# Patient Record
Sex: Female | Born: 1990 | Race: Black or African American | Hispanic: No | Marital: Single | Smoking: Current every day smoker
Health system: Southern US, Community
[De-identification: ages and names within clinical notes are randomized; demographics above are authoritative.]

## PROBLEM LIST (undated history)

## (undated) DIAGNOSIS — R519 Headache, unspecified: Secondary | ICD-10-CM

## (undated) DIAGNOSIS — H4710 Unspecified papilledema: Secondary | ICD-10-CM

## (undated) DIAGNOSIS — F329 Major depressive disorder, single episode, unspecified: Secondary | ICD-10-CM

## (undated) HISTORY — PX: WISDOM TOOTH EXTRACTION: SHX21

## (undated) HISTORY — DX: Unspecified papilledema: H47.10

## (undated) HISTORY — PX: OTHER SURGICAL HISTORY: SHX169

## (undated) HISTORY — DX: Morbid (severe) obesity due to excess calories: E66.01

## (undated) HISTORY — DX: Major depressive disorder, single episode, unspecified: F32.9

## (undated) HISTORY — DX: Headache, unspecified: R51.9

---
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
## 2009-01-30 ENCOUNTER — Emergency Department: Payer: Self-pay | Admitting: Unknown Physician Specialty

## 2009-08-15 ENCOUNTER — Observation Stay: Payer: Self-pay

## 2009-11-02 ENCOUNTER — Inpatient Hospital Stay: Payer: Self-pay | Admitting: Internal Medicine

## 2009-12-21 ENCOUNTER — Emergency Department: Payer: Self-pay | Admitting: Internal Medicine

## 2010-06-11 ENCOUNTER — Emergency Department: Payer: Self-pay | Admitting: Emergency Medicine

## 2011-03-27 ENCOUNTER — Emergency Department: Payer: Self-pay | Admitting: Emergency Medicine

## 2012-08-05 ENCOUNTER — Emergency Department: Payer: Self-pay | Admitting: Emergency Medicine

## 2012-08-05 LAB — URINALYSIS, COMPLETE
Blood: NEGATIVE
Glucose,UR: NEGATIVE mg/dL (ref 0–75)
Leukocyte Esterase: NEGATIVE
Nitrite: NEGATIVE
Protein: 100
RBC,UR: 2 /HPF (ref 0–5)
Specific Gravity: 1.019 (ref 1.003–1.030)
Squamous Epithelial: 3
WBC UR: 3 /HPF (ref 0–5)

## 2012-08-05 LAB — COMPREHENSIVE METABOLIC PANEL
Albumin: 2.6 g/dL — ABNORMAL LOW (ref 3.4–5.0)
Alkaline Phosphatase: 115 U/L (ref 50–136)
BUN: 7 mg/dL (ref 7–18)
Bilirubin,Total: 0.4 mg/dL (ref 0.2–1.0)
Glucose: 85 mg/dL (ref 65–99)
Osmolality: 273 (ref 275–301)
Potassium: 4.1 mmol/L (ref 3.5–5.1)
SGOT(AST): 46 U/L — ABNORMAL HIGH (ref 15–37)

## 2012-08-05 LAB — CBC
HGB: 12.4 g/dL (ref 12.0–16.0)
MCH: 30.1 pg (ref 26.0–34.0)
MCV: 89 fL (ref 80–100)
Platelet: 307 10*3/uL (ref 150–440)
RDW: 13.8 % (ref 11.5–14.5)

## 2012-08-05 LAB — GC/CHLAMYDIA PROBE AMP

## 2012-10-15 ENCOUNTER — Observation Stay: Payer: Self-pay | Admitting: Obstetrics and Gynecology

## 2012-10-15 LAB — URINALYSIS, COMPLETE
Blood: NEGATIVE
Glucose,UR: NEGATIVE mg/dL (ref 0–75)
Ketone: NEGATIVE
Nitrite: NEGATIVE
Ph: 8 (ref 4.5–8.0)
RBC,UR: 1 /HPF (ref 0–5)
Specific Gravity: 1.016 (ref 1.003–1.030)
Squamous Epithelial: NONE SEEN
WBC UR: 2 /HPF (ref 0–5)

## 2012-11-05 ENCOUNTER — Ambulatory Visit: Payer: Self-pay | Admitting: Obstetrics and Gynecology

## 2012-11-18 ENCOUNTER — Ambulatory Visit: Payer: Self-pay | Admitting: Obstetrics and Gynecology

## 2012-11-22 ENCOUNTER — Observation Stay: Payer: Self-pay | Admitting: Obstetrics and Gynecology

## 2012-11-22 LAB — COMPREHENSIVE METABOLIC PANEL
Albumin: 2.4 g/dL — ABNORMAL LOW (ref 3.4–5.0)
Alkaline Phosphatase: 140 U/L — ABNORMAL HIGH (ref 50–136)
Anion Gap: 6 — ABNORMAL LOW (ref 7–16)
Bilirubin,Total: 0.4 mg/dL (ref 0.2–1.0)
Chloride: 106 mmol/L (ref 98–107)
Co2: 24 mmol/L (ref 21–32)
EGFR (Non-African Amer.): >60
Osmolality: 268 (ref 275–301)
Potassium: 3.7 mmol/L (ref 3.5–5.1)
SGPT (ALT): 33 U/L (ref 12–78)
Total Protein: 6 g/dL — ABNORMAL LOW (ref 6.4–8.2)

## 2012-11-22 LAB — URINALYSIS, COMPLETE
Bilirubin,UR: NEGATIVE
Glucose,UR: NEGATIVE mg/dL (ref 0–75)
Ketone: NEGATIVE
Ph: 7 (ref 4.5–8.0)
RBC,UR: 1 /HPF (ref 0–5)
Specific Gravity: 1.014 (ref 1.003–1.030)
WBC UR: 8 /HPF (ref 0–5)

## 2012-11-22 LAB — CBC WITH DIFFERENTIAL/PLATELET
Basophil %: 0.1 %
Eosinophil #: 0 10*3/uL (ref 0.0–0.7)
HCT: 34.6 % — ABNORMAL LOW (ref 35.0–47.0)
HGB: 12.3 g/dL (ref 12.0–16.0)
Lymphocyte #: 1.3 10*3/uL (ref 1.0–3.6)
Lymphocyte %: 10.8 %
MCH: 31.8 pg (ref 26.0–34.0)
Neutrophil #: 10 10*3/uL — ABNORMAL HIGH (ref 1.4–6.5)
Platelet: 213 10*3/uL (ref 150–440)
RBC: 3.86 10*6/uL (ref 3.80–5.20)
RDW: 13.8 % (ref 11.5–14.5)
WBC: 12.1 10*3/uL — ABNORMAL HIGH (ref 3.6–11.0)

## 2012-11-22 LAB — LIPASE, BLOOD: Lipase: 103 U/L (ref 73–393)

## 2012-12-18 ENCOUNTER — Ambulatory Visit: Payer: Self-pay | Admitting: Obstetrics and Gynecology

## 2012-12-30 ENCOUNTER — Ambulatory Visit: Payer: Self-pay | Admitting: Obstetrics and Gynecology

## 2012-12-30 LAB — CBC WITH DIFFERENTIAL/PLATELET
Basophil #: 0 10*3/uL (ref 0.0–0.1)
Basophil %: 0.2 %
Eosinophil #: 0 10*3/uL (ref 0.0–0.7)
HCT: 37.1 % (ref 35.0–47.0)
HGB: 13 g/dL (ref 12.0–16.0)
Lymphocyte #: 2.3 10*3/uL (ref 1.0–3.6)
MCH: 31.4 pg (ref 26.0–34.0)
MCHC: 35 g/dL (ref 32.0–36.0)
Monocyte #: 0.8 x10 3/mm (ref 0.2–0.9)
Neutrophil #: 7.8 10*3/uL — ABNORMAL HIGH (ref 1.4–6.5)
Platelet: 224 10*3/uL (ref 150–440)
RBC: 4.13 10*6/uL (ref 3.80–5.20)
RDW: 14 % (ref 11.5–14.5)
WBC: 11 10*3/uL (ref 3.6–11.0)

## 2012-12-31 ENCOUNTER — Inpatient Hospital Stay: Payer: Self-pay | Admitting: Obstetrics and Gynecology

## 2012-12-31 LAB — GC/CHLAMYDIA PROBE AMP

## 2013-01-01 LAB — HEMATOCRIT: HCT: 33 % — ABNORMAL LOW (ref 35.0–47.0)

## 2013-01-18 ENCOUNTER — Emergency Department: Payer: Self-pay | Admitting: Emergency Medicine

## 2013-02-11 ENCOUNTER — Encounter: Payer: Self-pay | Admitting: *Deleted

## 2013-02-20 ENCOUNTER — Ambulatory Visit: Payer: Self-pay | Admitting: Obstetrics and Gynecology

## 2013-02-26 ENCOUNTER — Encounter: Payer: Self-pay | Admitting: General Surgery

## 2013-02-26 ENCOUNTER — Ambulatory Visit (INDEPENDENT_AMBULATORY_CARE_PROVIDER_SITE_OTHER): Payer: Medicaid Other | Admitting: General Surgery

## 2013-02-26 VITALS — BP 136/78 | HR 74 | Resp 16 | Ht 64.0 in | Wt 256.0 lb

## 2013-02-26 DIAGNOSIS — K8010 Calculus of gallbladder with chronic cholecystitis without obstruction: Secondary | ICD-10-CM

## 2013-02-26 NOTE — Patient Instructions (Addendum)
Laparoscopic Cholecystectomy Laparoscopic cholecystectomy is surgery to remove the gallbladder. The gallbladder is located slightly to the right of center in the abdomen, behind the liver. It is a concentrating and storage sac for the bile produced in the liver. Bile aids in the digestion and absorption of fats. Gallbladder disease (cholecystitis) is an inflammation of your gallbladder. This condition is usually caused by a buildup of gallstones (cholelithiasis) in your gallbladder. Gallstones can block the flow of bile, resulting in inflammation and pain. In severe cases, emergency surgery may be required. When emergency surgery is not required, you will have time to prepare for the procedure. Laparoscopic surgery is an alternative to open surgery. Laparoscopic surgery usually has a shorter recovery time. Your common bile duct may also need to be examined and explored. Your caregiver will discuss this with you if he or she feels this should be done. If stones are found in the common bile duct, they may be removed. LET YOUR CAREGIVER KNOW ABOUT:  Allergies to food or medicine.  Medicines taken, including vitamins, herbs, eyedrops, over-the-counter medicines, and creams.  Use of steroids (by mouth or creams).  Previous problems with anesthetics or numbing medicines.  History of bleeding problems or blood clots.  Previous surgery.  Other health problems, including diabetes and kidney problems.  Possibility of pregnancy, if this applies. RISKS AND COMPLICATIONS All surgery is associated with risks. Some problems that may occur following this procedure include:  Infection.  Damage to the common bile duct, nerves, arteries, veins, or other internal organs such as the stomach or intestines.  Bleeding.  A stone may remain in the common bile duct. BEFORE THE PROCEDURE  Do not take aspirin for 3 days prior to surgery or blood thinners for 1 week prior to surgery.  Do not eat or drink  anything after midnight the night before surgery.  Let your caregiver know if you develop a cold or other infectious problem prior to surgery.  You should be present 60 minutes before the procedure or as directed. PROCEDURE  You will be given medicine that makes you sleep (general anesthetic). When you are asleep, your surgeon will make several small cuts (incisions) in your abdomen. One of these incisions is used to insert a small, lighted scope (laparoscope) into the abdomen. The laparoscope helps the surgeon see into your abdomen. Carbon dioxide gas will be pumped into your abdomen. The gas allows more room for the surgeon to perform your surgery. Other operating instruments are inserted through the other incisions. Laparoscopic procedures may not be appropriate when:  There is major scarring from previous surgery.  The gallbladder is extremely inflamed.  There are bleeding disorders or unexpected cirrhosis of the liver.  A pregnancy is near term.  Other conditions make the laparoscopic procedure impossible. If your surgeon feels it is not safe to continue with a laparoscopic procedure, he or she will perform an open abdominal procedure. In this case, the surgeon will make an incision to open the abdomen. This gives the surgeon a larger view and field to work within. This may allow the surgeon to perform procedures that sometimes cannot be performed with a laparoscope alone. Open surgery has a longer recovery time. AFTER THE PROCEDURE  You will be taken to the recovery area where a nurse will watch and check your progress.  You may be allowed to go home the same day.  Do not resume physical activities until directed by your caregiver.  You may resume a normal diet and   activities as directed. Document Released: 03/06/2005 Document Revised: 05/29/2011 Document Reviewed: 10/16/2012 [facility] Patient Information 2014 [place], [place].    Patient's surgery has been scheduled for 03/17/13  at [facility]. Phone interview is scheduled for 03/10/13 between 9am and 1 pm. Patient aware of date and time and instructions. She will call us with any medication or health status changes.

## 2013-02-26 NOTE — Progress Notes (Signed)
[name], female   DOB: [birth year], 22 y.o.   MRN: [id number]  Chief Complaint  Patient presents with  . Other    HPI Rebecca Li is a 22 y.o. female here today for a evaluation of gallstone. Patient states she went to the ER in August 2014 for this problem, they did a ultrasound . She had a son 1 months ago and every since she gave birth she thinks the pain is worst.She states when she eats pancakes she has an attack and the pain last for about 10 minutes. Pain is located in the right back and the right upper quadrant area.No vomiting but nausea.   HPI  History reviewed. No pertinent past medical history.  Past Surgical History  Procedure Laterality Date  . Cesarean section  2006,2011,2014    History reviewed. No pertinent family history.  Social History History  Substance Use Topics  . Smoking status: Current Every Day Smoker -- 1.00 packs/day for 3 years  . Smokeless tobacco: Never Used  . Alcohol Use: No    Allergies  Allergen Reactions  . Sulfamethoxazole-Trimethoprim     REACTION: swelling and hives    No current outpatient prescriptions on file.   No current facility-administered medications for this visit.    Review of Systems Review of Systems  Constitutional: Negative.   Respiratory: Negative.   Cardiovascular: Negative.   Gastrointestinal: Positive for nausea, abdominal pain, blood in stool and anal bleeding. Negative for vomiting, diarrhea, constipation, abdominal distention and rectal pain.    Blood pressure 136/78, pulse 74, resp. rate 16, height 5\' 4"  (1.626 m), weight 256 lb (116.121 kg), last menstrual period 01/20/2013.  Physical Exam Physical Exam  Constitutional: She is oriented to person, place, and time. She appears well-developed and well-nourished.  Eyes: Conjunctivae are normal. No scleral icterus.  Neck: No tracheal deviation present. No thyromegaly present.  Cardiovascular: Normal rate, regular rhythm and normal heart  sounds.   Pulmonary/Chest: Effort normal and breath sounds normal.  Abdominal: Soft. Normal appearance and bowel sounds are normal. There is no hepatomegaly. There is no tenderness.  Lymphadenopathy:    She has no cervical adenopathy.  Neurological: She is alert and oriented to person, place, and time.  Skin: Skin is warm.    Data Reviewed Ultrasound reviewed-showing multiple gallstones  Assessment    Biliary colic.    Plan    Recommended Laparoscopic cholecystectomy. Reasons, risks, benefits discussed fully and pt is agreeable.     Patient's surgery has been scheduled for 03/17/13 at [facility]. Phone interview is scheduled for 03/10/13 between 9am and 1 pm. Patient aware of date and time and instructions. She will call us with any medication or health status changes.  SANKAR,SEEPLAPUTHUR G 02/26/2013, 4:36 PM

## 2013-03-04 ENCOUNTER — Other Ambulatory Visit: Payer: Self-pay | Admitting: General Surgery

## 2013-03-04 DIAGNOSIS — K8010 Calculus of gallbladder with chronic cholecystitis without obstruction: Secondary | ICD-10-CM

## 2013-03-11 ENCOUNTER — Ambulatory Visit: Payer: Self-pay | Admitting: Anesthesiology

## 2013-03-11 LAB — CBC WITH DIFFERENTIAL/PLATELET
Basophil #: 0 10*3/uL (ref 0.0–0.1)
Basophil %: 0.4 %
Eosinophil #: 0.4 10*3/uL (ref 0.0–0.7)
Eosinophil %: 3.1 %
HCT: 39.4 % (ref 35.0–47.0)
HGB: 13 g/dL (ref 12.0–16.0)
Lymphocyte #: 3.8 10*3/uL — ABNORMAL HIGH (ref 1.0–3.6)
MCHC: 33 g/dL (ref 32.0–36.0)
MCV: 89 fL (ref 80–100)
Monocyte #: 0.8 x10 3/mm (ref 0.2–0.9)
Monocyte %: 5.8 %
Neutrophil #: 8.3 10*3/uL — ABNORMAL HIGH (ref 1.4–6.5)
Neutrophil %: 62.4 %
Platelet: 302 10*3/uL (ref 150–440)
RBC: 4.42 10*6/uL (ref 3.80–5.20)
WBC: 13.3 10*3/uL — ABNORMAL HIGH (ref 3.6–11.0)

## 2013-03-11 LAB — CBC
HCT: 39 %
MCH: 29.3
MCHC: 33
MCV: 89 fL (ref 82.0–108.0)
Platelets: 302

## 2013-03-11 LAB — LAB REPORT - SCANNED
ALT: 33 U/L (ref 7–35)
Alkaline Phosphatase: 117 U/L (ref 25–125)
Bilirubin, Direct: 0.1 mg/dL (ref 0.01–0.4)
Total Bilirubin: 0.2 mg/dL (ref 0.1–1.4)
Total Protein, Serum: 6.5

## 2013-03-11 LAB — BASIC METABOLIC PANEL
BUN, Bld: 13
CO2, serum: 28
Chloride: 112 mmol/L — ABNORMAL HIGH (ref 98–107)
Creat: 0.9
Creatinine: 0.9 mg/dL (ref 0.60–1.30)
EGFR (African American): >60
EGFR (Non-African Amer.): >60
Glucose: 99
Osmolality: 281 (ref 275–301)
Potassium: 4.3 mmol/L (ref 3.5–5.1)
Sodium, serum: 141

## 2013-03-11 LAB — HEPATIC FUNCTION PANEL A (ARMC)
Albumin: 3.2 g/dL — ABNORMAL LOW (ref 3.4–5.0)
Bilirubin, Direct: <0.1 mg/dL (ref 0.00–0.20)
Bilirubin,Total: 0.2 mg/dL (ref 0.2–1.0)
SGPT (ALT): 33 U/L (ref 12–78)
Total Protein: 6.5 g/dL (ref 6.4–8.2)

## 2013-03-11 LAB — HEPATIC FUNCTION PANEL
AST: 24 U/L
Albumin, Serum(Neph): 3.2
Alkaline Phosphatase: 117 U/L
Protein: 6.5

## 2013-03-17 ENCOUNTER — Encounter: Payer: Self-pay | Admitting: General Surgery

## 2013-03-17 ENCOUNTER — Ambulatory Visit: Payer: Self-pay | Admitting: General Surgery

## 2013-03-17 DIAGNOSIS — K8010 Calculus of gallbladder with chronic cholecystitis without obstruction: Secondary | ICD-10-CM

## 2013-03-18 LAB — PATHOLOGY REPORT

## 2013-03-24 ENCOUNTER — Encounter: Payer: Self-pay | Admitting: General Surgery

## 2013-03-31 ENCOUNTER — Ambulatory Visit: Payer: Medicaid Other | Admitting: General Surgery

## 2013-04-09 ENCOUNTER — Emergency Department: Payer: Self-pay | Admitting: Emergency Medicine

## 2013-04-09 LAB — CBC WITH DIFFERENTIAL/PLATELET
BASOS PCT: 0.3 %
Basophil #: 0.1 10*3/uL (ref 0.0–0.1)
Eosinophil #: 0 10*3/uL (ref 0.0–0.7)
Eosinophil %: 0.1 %
HCT: 43.3 % (ref 35.0–47.0)
HGB: 14.5 g/dL (ref 12.0–16.0)
Lymphocyte #: 1.9 10*3/uL (ref 1.0–3.6)
Lymphocyte %: 9 %
MCH: 29.1 pg (ref 26.0–34.0)
MCHC: 33.4 g/dL (ref 32.0–36.0)
MCV: 87 fL (ref 80–100)
Monocyte #: 1 x10 3/mm — ABNORMAL HIGH (ref 0.2–0.9)
Monocyte %: 4.7 %
Neutrophil #: 17.9 10*3/uL — ABNORMAL HIGH (ref 1.4–6.5)
Neutrophil %: 85.9 %
Platelet: 261 10*3/uL (ref 150–440)
RBC: 4.96 10*6/uL (ref 3.80–5.20)
RDW: 12.9 % (ref 11.5–14.5)
WBC: 20.8 10*3/uL — AB (ref 3.6–11.0)

## 2013-04-09 LAB — URINALYSIS, COMPLETE
Bilirubin,UR: NEGATIVE
GLUCOSE, UR: NEGATIVE mg/dL (ref 0–75)
Nitrite: NEGATIVE
PH: 6 (ref 4.5–8.0)
Protein: 100
RBC,UR: 72 /HPF (ref 0–5)
Specific Gravity: 1.027 (ref 1.003–1.030)

## 2013-04-09 LAB — COMPREHENSIVE METABOLIC PANEL
Albumin: 3.3 g/dL — ABNORMAL LOW (ref 3.4–5.0)
Alkaline Phosphatase: 164 U/L — ABNORMAL HIGH
Anion Gap: 8 (ref 7–16)
BUN: 9 mg/dL (ref 7–18)
Bilirubin,Total: 1.6 mg/dL — ABNORMAL HIGH (ref 0.2–1.0)
CALCIUM: 8.9 mg/dL (ref 8.5–10.1)
Chloride: 104 mmol/L (ref 98–107)
Co2: 24 mmol/L (ref 21–32)
Creatinine: 1.16 mg/dL (ref 0.60–1.30)
EGFR (African American): >60
EGFR (Non-African Amer.): >60
Glucose: 95 mg/dL (ref 65–99)
OSMOLALITY: 270 (ref 275–301)
Potassium: 3.3 mmol/L — ABNORMAL LOW (ref 3.5–5.1)
SGOT(AST): 24 U/L (ref 15–37)
SGPT (ALT): 77 U/L (ref 12–78)
SODIUM: 136 mmol/L (ref 136–145)
TOTAL PROTEIN: 7.9 g/dL (ref 6.4–8.2)

## 2013-04-09 LAB — LIPASE, BLOOD: Lipase: 103 U/L (ref 73–393)

## 2013-04-10 LAB — RAPID INFLUENZA A&B ANTIGENS (ARMC ONLY)

## 2013-04-11 LAB — URINE CULTURE

## 2013-04-12 LAB — CULTURE, BLOOD (SINGLE)

## 2013-04-16 LAB — CULTURE, BLOOD (SINGLE)

## 2013-06-17 ENCOUNTER — Emergency Department: Payer: Self-pay | Admitting: Emergency Medicine

## 2013-06-17 LAB — CBC WITH DIFFERENTIAL/PLATELET
BASOS ABS: 0.1 10*3/uL (ref 0.0–0.1)
BASOS PCT: 0.4 %
Eosinophil #: 0.5 10*3/uL (ref 0.0–0.7)
Eosinophil %: 3 %
HCT: 42.3 % (ref 35.0–47.0)
HGB: 13.6 g/dL (ref 12.0–16.0)
LYMPHS PCT: 37.5 %
Lymphocyte #: 5.7 10*3/uL — ABNORMAL HIGH (ref 1.0–3.6)
MCH: 28.5 pg (ref 26.0–34.0)
MCHC: 32.2 g/dL (ref 32.0–36.0)
MCV: 89 fL (ref 80–100)
Monocyte #: 1 x10 3/mm — ABNORMAL HIGH (ref 0.2–0.9)
Monocyte %: 6.4 %
NEUTROS PCT: 52.7 %
Neutrophil #: 8 10*3/uL — ABNORMAL HIGH (ref 1.4–6.5)
Platelet: 299 10*3/uL (ref 150–440)
RBC: 4.78 10*6/uL (ref 3.80–5.20)
RDW: 13.2 % (ref 11.5–14.5)
WBC: 15.1 10*3/uL — ABNORMAL HIGH (ref 3.6–11.0)

## 2013-06-17 LAB — URINALYSIS, COMPLETE
Bilirubin,UR: NEGATIVE
Blood: NEGATIVE
Glucose,UR: NEGATIVE mg/dL (ref 0–75)
Ketone: NEGATIVE
NITRITE: NEGATIVE
Ph: 5 (ref 4.5–8.0)
RBC,UR: 7 /HPF (ref 0–5)
Specific Gravity: 1.019 (ref 1.003–1.030)
Squamous Epithelial: 8
WBC UR: 16 /HPF (ref 0–5)

## 2013-06-17 LAB — BASIC METABOLIC PANEL
Anion Gap: 6 — ABNORMAL LOW (ref 7–16)
BUN: 9 mg/dL (ref 7–18)
CALCIUM: 8.4 mg/dL — AB (ref 8.5–10.1)
Chloride: 110 mmol/L — ABNORMAL HIGH (ref 98–107)
Co2: 23 mmol/L (ref 21–32)
Creatinine: 0.74 mg/dL (ref 0.60–1.30)
EGFR (African American): >60
EGFR (Non-African Amer.): >60
GLUCOSE: 79 mg/dL (ref 65–99)
OSMOLALITY: 275 (ref 275–301)
POTASSIUM: 3.7 mmol/L (ref 3.5–5.1)
SODIUM: 139 mmol/L (ref 136–145)

## 2013-06-17 LAB — DRUG SCREEN, URINE
Amphetamines, Ur Screen: NEGATIVE (ref ?–1000)
BARBITURATES, UR SCREEN: NEGATIVE (ref ?–200)
Benzodiazepine, Ur Scrn: NEGATIVE (ref ?–200)
CANNABINOID 50 NG, UR ~~LOC~~: POSITIVE (ref ?–50)
COCAINE METABOLITE, UR ~~LOC~~: NEGATIVE (ref ?–300)
MDMA (Ecstasy)Ur Screen: NEGATIVE (ref ?–500)
METHADONE, UR SCREEN: NEGATIVE (ref ?–300)
Opiate, Ur Screen: NEGATIVE (ref ?–300)
PHENCYCLIDINE (PCP) UR S: NEGATIVE (ref ?–25)
TRICYCLIC, UR SCREEN: NEGATIVE (ref ?–1000)

## 2013-06-17 LAB — PREGNANCY, URINE: PREGNANCY TEST, URINE: NEGATIVE m[IU]/mL

## 2013-06-17 LAB — TROPONIN I: Troponin-I: <0.02 ng/mL

## 2013-06-18 LAB — TROPONIN I: Troponin-I: <0.02 ng/mL

## 2013-06-19 LAB — URINE CULTURE

## 2014-01-19 ENCOUNTER — Encounter: Payer: Self-pay | Admitting: General Surgery

## 2014-07-10 NOTE — Op Note (Signed)
PATIENT NAME:  Rebecca Li, Rebecca Li MR#:  [id number] DATE OF BIRTH:  [birth year]  DATE OF PROCEDURE:  03/17/2013  PREOPERATIVE DIAGNOSIS: Chronic cholecystitis and cholelithiasis.   POSTOPERATIVE DIAGNOSIS: Chronic cholecystitis and cholelithiasis.   OPERATION: Laparoscopy and cholecystectomy, intraoperative cholangiogram.   SURGEON: Kathreen CosierS. G. Dionel Archey, M.D.   ANESTHESIA: General.   COMPLICATIONS: None.   ESTIMATED BLOOD LOSS: Minimal.   DRAINS: None.   DESCRIPTION OF PROCEDURE: The patient was put to sleep in the supine position on the operating room table. The abdomen was prepped and draped out as a sterile field. Timeout was performed. The incision was made above the umbilicus and the Veress needle with the InnerDyne sleeve positioned in the peritoneal cavity and verified with the hanging drop method. Pneumoperitoneum was obtained and a 10 mm port was placed. The camera was introduced with good visualization of the peritoneal cavity. Epigastric and two lateral 5 mm ports were placed. No gross abnormalities of the liver or the adjoining portions of the stomach and bowel were noted. The gallbladder was noted to be moderately thickened in its wall and mildly distended, but showed no acute changes. The gallbladder was partially decompressed with a needle and aspirated some clear bile with easy manipulation thereafter. Cephalad traction was applied over the fundus of the gallbladder. The Hartmann's pouch area was dissected off with takedown of some fatty adhesions. The cystic duct was easily identified and freed. The cystic artery likewise was easily identified and freed. The cystic artery was hemoclipped and cut. The cystic duct was somewhat wide and thick and appeared to contain some stones. These were milked upward and a Kumar clamp and catheter were positioned and cholangiogram was performed. This showed some preferential filling of the gallbladder, but did fill the bile duct both proximal and distal, with  no evidence of obstruction to flow. The catheter was then used to decompress the gallbladder. Cystic duct was proximally hemoclipped and cut and since the size of the duct was somewhat vague an Endoloop was used to tie off the end of the cystic duct and a clip placed above it to reinforce the area. The gallbladder was dissected free from its bed using cautery for control of bleeding. The area was irrigated with a small amount of fluid and suctioned out. Using a retrieval bag through the umbilical port site, the gallbladder was brought out through the umbilical port site, noted to contain multiple small stones and 1 large stone was compacted with multiple little ones. The fascial opening at the umbilicus was closed with 0 Vicryl with the use of a suture passer. The skin incisions were then closed with subcuticular 4-0 Vicryl after all the ports were removed and pneumoperitoneum was released. The incision was reinforced with Steri-Strips and dry sterile dressing was placed. The patient tolerated the procedure well with no immediate problems encountered. She was then returned to the recovery room in stable condition.  ____________________________ S.Wynona LunaG. Daina Cara, MD sgs:sb D: 03/17/2013 10:11:56 ET T: 03/17/2013 11:26:55 ET JOB#: [id number]  cc: S.G. Evette CristalSankar, MD, <Dictator> [facility] Association PcEEPLAPUTH Wynona LunaG Leocadio Heal MD ELECTRONICALLY SIGNED 03/17/2013 17:46

## 2014-07-10 NOTE — Op Note (Signed)
PATIENT NAME:  Rebecca Li, COOTE MR#:  [id number] DATE OF BIRTH:  December 30, 1990  DATE OF PROCEDURE:  12/31/2012  PREOPERATIVE DIAGNOSES:  84. A 24 year old G3, P2-0-0-1, at 68 weeks' 0 days' gestation.  2. Morbid obesity.  3. Gestational diabetes.  4. History of 2 prior cesarean sections.   POSTOPERATIVE DIAGNOSES:  74. A 24 year old G3, P2-0-0-1, at 103 weeks' 0 days' gestation.  2. Morbid obesity.  3. Gestational diabetes.  4. History of 2 prior cesarean sections.   OPERATION PERFORMED: Repeat low transverse cesarean section via Pfannenstiel skin incision.   ANESTHESIA USED: Spinal.   PREOPERATIVE ANTIBIOTICS: Ancef 3 g.   PRIMARY SURGEON: Florina Ou. Bonney Aid, M.D.   ASSISTANT: Senaida Lange, M.D.   ESTIMATED BLOOD LOSS: 700 mL.   OPERATIVE FLUIDS: 2 liters.   DRAINS OR TUBES: Foley to gravity drainage, On-Q catheter system.   IMPLANTS: None.   COMPLICATIONS: None.   SPECIMENS REMOVED: None.   INTRAOPERATIVE FINDINGS: Normal uterus. Normal left tube and ovary. Right tube and ovary were not visualized secondary to dense adhesive disease of the right aspect of the uterus to the pelvic sidewall. There were also some adhesions of the fundus involving the rectus muscle close to the midline. Delivery resulted in the birth of a liveborn female infant weighing 4210 grams, 9 pounds 5 ounces, Apgars 8 and 9.   PATIENT CONDITION FOLLOWING THE PROCEDURE: Stable.   PROCEDURE IN DETAIL: Risks, benefits and alternatives of the procedure were discussed with the patient prior to proceeding to the operating room. The patient was taken to the operating room where spinal anesthesia was administered. She was positioned in the supine position, prepped and draped in the usual sterile fashion. A timeout procedure was performed, and the level of anesthetic was checked and noted to be adequate prior to proceeding with the case. A Pfannenstiel skin incision was made 2 cm above the pubic symphysis using the  scalpel and utilizing the patient's pre-existing scar. This was carried down sharply to the level of the rectus fascia which was incised in the midline with the scalpel, then extended using Mayo scissors. The superior border of the rectus fascia was grasped with 2 Kocher clamps, and the underlying rectus muscles were dissected off using a combination of blunt dissection and scalpel. The inferior border of the rectus muscles was dissected off in a similar fashion, with the median raphe being incised using Mayo scissors. The midline was identified. Entry into the peritoneum had been accomplished at the time the rectus muscles were being taken down. The peritoneal incision was extended using manual traction. An Alexis retractor was then placed to allow visualization of the lower uterine segment. A low transverse incision was made on the uterus and entered bluntly using the operator's finger. The hysterotomy incision was extended using manual traction. The fetus was noted to be in the footling breech position. The feet were grasped and brought to the incision where the infant was delivered to the level of the right scapula. The right arm was splinted across the infant's chest and delivered. The infant was then rotated 180 degrees, and the left arm was splinted and swept across the fetal chest for delivery in a similar fashion. The fetal vertex was then grasped and flexed using Mauriceau-Smellie-Veit maneuver while fundal pressure was applied resulting in delivery of the head. There was light meconium noted at the time of the hysterotomy; however, the infant cried spontaneously and was suctioned prior to cutting the cord and being passed to the  awaiting pediatricians. Cord blood was obtained. The placenta was delivered using manual extraction. The uterus was unable to be exteriorized secondary to dense adhesions on the patient's right side. It was, therefore, repaired in situ. The hysterotomy incision was closed with a 2  layer closure of 0 Vicryl with the first being a running locked, the second a vertical imbricating. The hysterotomy incision was reinspected. There was one aspect on the left portion of the hysterotomy incision which had some oozing and was oversewn with a single figure-of-eight. The peritoneal gutters were wiped clean of clots and debris using 2 moist laps. The Alexis retractor was removed. The muscles were reapproximated in the midline using a 2-0 Vicryl. The On-Q catheter system was placed subfascially per the usual protocol. The fascia was closed using a looped #1 PDS in a running fashion. The subcutaneous tissue was then irrigated. Hemostasis was achieved using the Bovie. The subcutaneous dead space was reapproximated using a 2-0 chromic on a 53T needle. The skin was closed using staples. Each On-Q catheter was bolused with 5 mL of 0.5% bupivacaine each. Sponge, needle and instrument counts were correct x 2. The patient tolerated the procedure well and was taken to the recovery room in stable condition.   ____________________________ Florina OuAndreas M. Bonney AidStaebler, MD ams:gb D: 01/01/2013 19:57:14 ET T: 01/01/2013 20:27:29 ET JOB#: [id number]  cc: Florina OuAndreas M. Bonney AidStaebler, MD, <Dictator> Carmel SacramentoANDREAS Cathrine MusterM Anallely Rosell MD ELECTRONICALLY SIGNED 01/01/2013 22:00

## 2014-07-28 NOTE — H&P (Signed)
L&D Evaluation:  History:  HPI 24 [facility]3P2001 [facility] presenting with right upper quadrant pain for the past 24-hrs.  She has had a prior RUQ US this pregnancy with working diagnosis of biliary colic.  This episode of pain started after eating a bananna. Denies fevers, chills, emesis.  Reports some mild nausea.  +FM, no LOF, no VB, no ctx.  Pregnancy at WOB noteable for Obesity BMI 44 and GDM being followed in [facility] clinic.  Most recent US at 30 weeks revealed EFW c/w 76%ile.   PNL B+ / RI / VZI / RI / HBsAg neg / HIV neg / RPR NR / + Gonorrhea with negative TOC / early glucola 131 with repeat at 28 weeks of 163 and 3-hr of 86 / 207 / 214 / 67   Presents with abdominal pain   Patient's Medical History Obesity, GDM, depression   Patient's Surgical History Previous C-Section  wisdom teet extraction   Medications Pre [place]atal Vitamins   Allergies septra   Social History none   Family History Non-Contributory   ROS:  ROS All systems were reviewed.  HEENT, CNS, GI, GU, Respiratory, CV, Renal and Musculoskeletal systems were found to be normal.   Exam:  Vital Signs stable   Urine Protein not completed   General no apparent distress   Mental Status clear   Abdomen Soft, non-distended, no rebound, no guarding, positive murphies sign   Estimated Fetal Weight Average for gestational age   Back no CVAT   Edema no edema   FHT normal rate with no decels   Ucx absent   Impression:  Impression Possible billiary cholic vs cholecystitis   Plan:  Comments 1) Abdominal pain - CMP (normal), CBC (normal), lipase (normal)     - given labs likely cholelithiasis vs biliary colic     - will recheck RUQ US  2) Fetus category I tracing  3) Disposition pending results of RUQ ultrasound  Addendum RUQ US cholelithiasis as before but otherwise normal.  Discussed laboratory and ultrasound findings with Dr. Anda KraftMarterre recommends low fat diet otherwise no interventions at  present until postpartum.  Given Rx for Zofran for nausea   Follow Up Appointment already scheduled. 9/8   Electronic Signatures: Lorrene ReidStaebler, Orlandus Borowski M (MD)  (Signed 05-Sep-14 20:56)  Authored: L&D Evaluation   Last Updated: 05-Sep-14 20:56 by Lorrene ReidStaebler, Urie Loughner M (MD)

## 2015-07-22 HISTORY — PX: OTHER SURGICAL HISTORY: SHX169

## 2015-11-29 ENCOUNTER — Emergency Department
Admission: EM | Admit: 2015-11-29 | Discharge: 2015-11-29 | Disposition: A | Discharge status: Home / Self Care | Payer: Medicaid Other | Attending: Student | Admitting: Student

## 2015-11-29 ENCOUNTER — Encounter: Payer: Self-pay | Admitting: Occupational Medicine

## 2015-11-29 ENCOUNTER — Emergency Department: Payer: Medicaid Other

## 2015-11-29 DIAGNOSIS — Y939 Activity, unspecified: Secondary | ICD-10-CM | POA: Insufficient documentation

## 2015-11-29 DIAGNOSIS — Y999 Unspecified external cause status: Secondary | ICD-10-CM | POA: Insufficient documentation

## 2015-11-29 DIAGNOSIS — S61412A Laceration without foreign body of left hand, initial encounter: Secondary | ICD-10-CM

## 2015-11-29 DIAGNOSIS — Z23 Encounter for immunization: Secondary | ICD-10-CM | POA: Insufficient documentation

## 2015-11-29 DIAGNOSIS — Y929 Unspecified place or not applicable: Secondary | ICD-10-CM | POA: Insufficient documentation

## 2015-11-29 DIAGNOSIS — F17200 Nicotine dependence, unspecified, uncomplicated: Secondary | ICD-10-CM | POA: Insufficient documentation

## 2015-11-29 DIAGNOSIS — W25XXXA Contact with sharp glass, initial encounter: Secondary | ICD-10-CM | POA: Insufficient documentation

## 2015-11-29 MED ORDER — HYDROCODONE-ACETAMINOPHEN 5-325 MG PO TABS
1.0000 | ORAL_TABLET | Freq: Once | ORAL | Status: AC
  Administered 2015-11-29: 1 via ORAL
  Filled 2015-11-29: qty 1

## 2015-11-29 MED ORDER — IBUPROFEN 800 MG PO TABS: 800.0000 mg | ORAL_TABLET | Freq: Three times a day (TID) | ORAL | 0 refills | Status: DC | PRN

## 2015-11-29 MED ORDER — TETANUS-DIPHTH-ACELL PERTUSSIS 5-2.5-18.5 LF-MCG/0.5 IM SUSP
0.5000 mL | Freq: Once | INTRAMUSCULAR | Status: AC
  Administered 2015-11-29: 0.5 mL via INTRAMUSCULAR
  Filled 2015-11-29: qty 0.5

## 2015-11-29 MED ORDER — LIDOCAINE HCL 1 % IJ SOLN
5.0000 mL | Freq: Once | INTRAMUSCULAR | Status: AC
  Administered 2015-11-29: 5 mL
  Filled 2015-11-29: qty 5

## 2015-11-29 MED ORDER — LIDOCAINE HCL (PF) 1 % IJ SOLN
INTRAMUSCULAR | Status: AC
  Administered 2015-11-29: 5 mL
  Filled 2015-11-29: qty 5

## 2015-11-29 NOTE — ED Provider Notes (Signed)
[facility] Emergency Department Provider Note  ____________________________________________  Time seen: Approximately 10:58 PM  I have reviewed the triage vital signs and the nursing notes.   HISTORY  Chief Complaint Laceration (Left hand)    HPI Rebecca Li is a 25 y.o. female presents for evaluation of left hand laceration after knocking on a door. Patient states that the glass broke and cut the dorsal aspect of her left hand. Patient states tetanus unknown.   History reviewed. No pertinent past medical history.  Patient Active Problem List   Diagnosis Date Noted  . Calculus of gallbladder with other cholecystitis, without mention of obstruction 02/26/2013  . 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 WRIST PAIN 07/16/2008    Past Surgical History:  Procedure Laterality Date  . CESAREAN SECTION  2006,2011,2014    Prior to Admission medications   Medication Sig Start Date End Date Taking? Authorizing Provider  ibuprofen (ADVIL,MOTRIN) 800 MG tablet Take 1 tablet (800 mg total) by mouth every 8 (eight) hours as needed. 11/29/15   Evangeline Dakin, PA-C    Allergies Sulfamethoxazole-trimethoprim  History reviewed. No pertinent family history.  Social History Social History  Substance Use Topics  . Smoking status: Current Every Day Smoker    Packs/day: 1.00    Years: 3.00  . Smokeless tobacco: Never Used  . Alcohol use No    Review of Systems Constitutional: No fever/chills Cardiovascular: Denies chest pain. Respiratory: Denies shortness of breath. Musculoskeletal: Negative for back pain. Skin: Laceration dorsal aspect left hand. Neurological: Negative for headaches, focal weakness or numbness.  10-point ROS otherwise negative.  ____________________________________________   PHYSICAL EXAM:  VITAL SIGNS: ED Triage Vitals  Enc Vitals Group     BP 11/29/15 1933 (!) 145/95     Pulse Rate 11/29/15 1933 88     Resp 11/29/15 1933 18   Temp 11/29/15 1933 98.9 F (37.2 C)     Temp Source 11/29/15 1933 Oral     SpO2 11/29/15 1933 99 %     Weight 11/29/15 1933 250 lb (113.4 kg)     Height 11/29/15 1933 5\' 6"  (1.676 m)     Head Circumference --      Peak Flow --      Pain Score 11/29/15 1934 2     Pain Loc --      Pain Edu? --      Excl. in GC? --     Constitutional: Alert and oriented. Well appearing and in no acute distress. Musculoskeletal: Left hand full range of motion distally neurovascularly intact. Neurologic:  Normal speech and language. No gross focal neurologic deficits are appreciated. No gait instability. Skin:  Skin is warm, dry and intact. No rash noted. Laceration noted linear approximately 1 cm dorsal aspect of the left hand. No tendon involvement noted. Psychiatric: Mood and affect are normal. Speech and behavior are normal.  ____________________________________________   LABS (all labs ordered are listed, but only abnormal results are displayed)  Labs Reviewed - No data to display ____________________________________________  EKG   ____________________________________________  RADIOLOGY   ____________________________________________   PROCEDURES  Procedure(s) performed: Yes LACERATION REPAIR Performed by: Evangeline Dakin Authorized by: Evangeline Dakin Consent: Verbal consent obtained. Risks and benefits: risks, benefits and alternatives were discussed Consent given by: patient Patient identity confirmed: provided demographic data Prepped and Draped in normal sterile fashion Wound explored  Laceration Location: Dorsum left hand  Laceration Length: 1 cm  No Foreign Bodies seen or palpated  Anesthesia:  local infiltration  Local anesthetic: lidocaine 1% without epinephrine  Anesthetic total: 3 ml  Irrigation method: syringe Amount of cleaning: standard  Skin closure: 4-0 Ethilon  Number of sutures: 2  Technique: Simple interrupted  Patient tolerance: Patient  tolerated the procedure well with no immediate complications.  Critical Care performed: No  ____________________________________________   INITIAL IMPRESSION / ASSESSMENT AND PLAN / ED COURSE  Pertinent labs & imaging results that were available during my care of the patient were reviewed by me and considered in my medical decision making (see chart for details). Review of the  CSRS was performed in accordance of the [facility] prior to dispensing any controlled drugs.  Laceration dorsal aspect left hand. Tetanus updated while in ED patient discharged home with prescription for deformity 100 mg 3 times a day. Patient follow up PCP / sutures out in one week.  Clinical Course    ____________________________________________   FINAL CLINICAL IMPRESSION(S) / ED DIAGNOSES  Final diagnoses:  Laceration of hand, left, initial encounter     This chart was dictated using voice recognition software/Dragon. Despite best efforts to proofread, errors can occur which can change the meaning. Any change was purely unintentional.    Evangeline Dakinharles M Dakotah Orrego, PA-C 11/29/15 [id number]    Gayla DossEryka A Gayle, MD 11/30/15 [id number]

## 2015-11-29 NOTE — ED Notes (Signed)
Discharge instructions reviewed with patient. Questions fielded by this RN. Patient verbalizes understanding of instructions. Patient discharged home in stable condition per Beers PA. No acute distress noted at time of discharge.   

## 2015-11-29 NOTE — ED Triage Notes (Signed)
Pt presents to ED with left hand laceration after knocking on the door glass broke and cut her hand. Bleeding under control. Pt has good pulses. Pt able to move fingers.

## 2015-12-23 ENCOUNTER — Emergency Department: Payer: Medicaid Other

## 2015-12-23 ENCOUNTER — Encounter: Payer: Self-pay | Admitting: Medical Oncology

## 2015-12-23 ENCOUNTER — Emergency Department
Admission: EM | Admit: 2015-12-23 | Discharge: 2015-12-23 | Disposition: A | Discharge status: Home / Self Care | Payer: Medicaid Other | Attending: Emergency Medicine | Admitting: Emergency Medicine

## 2015-12-23 DIAGNOSIS — F17200 Nicotine dependence, unspecified, uncomplicated: Secondary | ICD-10-CM | POA: Diagnosis not present

## 2015-12-23 DIAGNOSIS — J069 Acute upper respiratory infection, unspecified: Secondary | ICD-10-CM

## 2015-12-23 DIAGNOSIS — R05 Cough: Secondary | ICD-10-CM | POA: Diagnosis present

## 2015-12-23 DIAGNOSIS — Z79899 Other long term (current) drug therapy: Secondary | ICD-10-CM | POA: Diagnosis not present

## 2015-12-23 LAB — CBC WITH DIFFERENTIAL/PLATELET
BASOS PCT: 1 %
Basophils Absolute: 0.1 10*3/uL (ref 0–0.1)
EOS ABS: 0.6 10*3/uL (ref 0–0.7)
EOS PCT: 4 %
HEMATOCRIT: 41.9 % (ref 35.0–47.0)
Hemoglobin: 13.9 g/dL (ref 12.0–16.0)
Lymphocytes Relative: 32 %
Lymphs Abs: 4 10*3/uL — ABNORMAL HIGH (ref 1.0–3.6)
MCH: 29.6 pg (ref 26.0–34.0)
MCHC: 33.1 g/dL (ref 32.0–36.0)
MCV: 89.2 fL (ref 80.0–100.0)
MONO ABS: 1.1 10*3/uL — AB (ref 0.2–0.9)
MONOS PCT: 9 %
NEUTROS ABS: 6.9 10*3/uL — AB (ref 1.4–6.5)
Neutrophils Relative %: 54 %
PLATELETS: 176 10*3/uL (ref 150–440)
RBC: 4.69 MIL/uL (ref 3.80–5.20)
RDW: 12.8 % (ref 11.5–14.5)
WBC: 12.6 10*3/uL — ABNORMAL HIGH (ref 3.6–11.0)

## 2015-12-23 LAB — BASIC METABOLIC PANEL
Anion gap: 6 (ref 5–15)
BUN: 6 mg/dL (ref 6–20)
CALCIUM: 8.3 mg/dL — AB (ref 8.9–10.3)
CO2: 22 mmol/L (ref 22–32)
CREATININE: 0.62 mg/dL (ref 0.44–1.00)
Chloride: 111 mmol/L (ref 101–111)
GFR calc Af Amer: >60 mL/min (ref >60)
GFR calc non Af Amer: >60 mL/min (ref >60)
GLUCOSE: 80 mg/dL (ref 65–99)
Potassium: 3.9 mmol/L (ref 3.5–5.1)
Sodium: 139 mmol/L (ref 135–145)

## 2015-12-23 LAB — URINALYSIS COMPLETE WITH MICROSCOPIC (ARMC ONLY)
BACTERIA UA: NONE SEEN
Bilirubin Urine: NEGATIVE
GLUCOSE, UA: NEGATIVE mg/dL
Ketones, ur: NEGATIVE mg/dL
Leukocytes, UA: NEGATIVE
NITRITE: NEGATIVE
Protein, ur: NEGATIVE mg/dL
SPECIFIC GRAVITY, URINE: 1.008 (ref 1.005–1.030)
pH: 7 (ref 5.0–8.0)

## 2015-12-23 MED ORDER — ONDANSETRON HCL 4 MG PO TABS: 4.0000 mg | ORAL_TABLET | Freq: Every day | ORAL | 1 refills | Status: DC | PRN

## 2015-12-23 MED ORDER — SODIUM CHLORIDE 0.9 % IV BOLUS (SEPSIS)
500.0000 mL | Freq: Once | INTRAVENOUS | Status: AC
  Administered 2015-12-23: 500 mL via INTRAVENOUS

## 2015-12-23 MED ORDER — ONDANSETRON HCL 4 MG/2ML IJ SOLN
4.0000 mg | Freq: Once | INTRAMUSCULAR | Status: AC
  Administered 2015-12-23: 4 mg via INTRAVENOUS
  Filled 2015-12-23: qty 2

## 2015-12-23 MED ORDER — GUAIFENESIN-CODEINE 100-10 MG/5ML PO SOLN: 10.0000 mL | Freq: Three times a day (TID) | ORAL | 0 refills | Status: DC | PRN

## 2015-12-23 NOTE — ED Triage Notes (Signed)
Pt reports that she has been having sinus pressure, congestion, cough and ear pain for a couple of days.

## 2015-12-23 NOTE — ED Notes (Signed)
Patient reports "water goes straight through me and gatorade goes straight through me."  Patient reports 1 episode of incontinence of stool this morning.  Patient reports taking antibiotics for the past several days.

## 2015-12-23 NOTE — ED Provider Notes (Signed)
[facility] Emergency Department Provider Note   ____________________________________________   First MD Initiated Contact with Patient 12/23/15 1224     (approximate)  I have reviewed the triage vital signs and the nursing notes.   HISTORY  Chief Complaint Nasal Congestion and Cough   HPI Rebecca Li is a 25 y.o. female who presents with fatigue, cough, and left ear pain x1 week. Patient went to urgent care 2 days ago and was given prescription for amoxicillin. At the urgent care they told her she was dehydrated and told her to drink lots of fluids. Since then she has been taking in mostly fluids and occasionally crackers as well. She is complaining today of mostly fatigue, nausea and vomiting and diarrhea. She denies abdominal pain, dysuria, or vaginal bleeding. She is unsure whether she has had any fevers, but has had chills. She reports that cough is productive of green sputum. Denies any sick contacts with similar symptoms. Denies headache, dizziness, chest pain or palpitations.    History reviewed. No pertinent past medical history.  Patient Active Problem List   Diagnosis Date Noted  . Calculus of gallbladder with other cholecystitis, without mention of obstruction 02/26/2013  . 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 WRIST PAIN 07/16/2008    Past Surgical History:  Procedure Laterality Date  . CESAREAN SECTION  2006,2011,2014    Prior to Admission medications   Medication Sig Start Date End Date Taking? Authorizing Provider  guaiFENesin-codeine 100-10 MG/5ML syrup Take 10 mLs by mouth 3 (three) times daily as needed for cough. 12/23/15   Charmayne Sheer Averianna Brugger, PA-C  ibuprofen (ADVIL,MOTRIN) 800 MG tablet Take 1 tablet (800 mg total) by mouth every 8 (eight) hours as needed. 11/29/15   Charmayne Sheer Amr Sturtevant, PA-C  ondansetron (ZOFRAN) 4 MG tablet Take 1 tablet (4 mg total) by mouth daily as needed for nausea or vomiting. 12/23/15 12/22/16  Charmayne Sheer Wofford Stratton, PA-C     Allergies Sulfamethoxazole-trimethoprim  No family history on file.  Social History Social History  Substance Use Topics  . Smoking status: Current Every Day Smoker    Packs/day: 1.00    Years: 3.00  . Smokeless tobacco: Never Used  . Alcohol use No    Review of Systems Constitutional: Positive for chills and fatigue.  Eyes: No visual changes, red eye, eye drainage. ENT: Positive for left ear pain, nasal congestion, and scratchy throat. Cardiovascular: Denies chest pain. Respiratory: Denies shortness of breath, wheezing. Positive for cough productive of green sputum.  Gastrointestinal: No abdominal pain.  Positive for nausea, vomiting, diarrhea.  Genitourinary: Negative for dysuria, vaginal discharge, vaginal bleeding. Musculoskeletal: Negative for body aches. Skin: Negative for rash. Neurological: Negative for headaches, focal weakness or numbness.  ____________________________________________   PHYSICAL EXAM:  VITAL SIGNS: ED Triage Vitals [12/23/15 1209]  Enc Vitals Group     BP 113/62     Pulse Rate 84     Resp 16     Temp 98.3 F (36.8 C)     Temp Source Oral     SpO2 99 %     Weight 240 lb (108.9 kg)     Height 5\' 6"  (1.676 m)     Head Circumference      Peak Flow      Pain Score 6     Pain Loc      Pain Edu?      Excl. in GC?     Constitutional: Alert and oriented. Well appearing and in no acute  distress. Patient remained on cell phone for entire history.  Eyes: Conjunctivae are normal.  Head: Atraumatic. Ears: Right canal and TM normal. Left canal mildly erythematous, left TM difficult to visualize but pearly grey in color with light reflected. No tenderness with manipulation of tragus or pinna bilaterally.  Nose: No congestion/rhinnorhea. Mouth/Throat: Mucous membranes are slightly dry.  Oropharynx non-erythematous and without exudate. Uvula is midline and not edematous.  Neck: No stridor. Supple, full ROM without pain or  difficulty. Hematological/Lymphatic/Immunilogical: No cervical lymphadenopathy. Cardiovascular: Normal rate, regular rhythm. Grossly normal heart sounds.  Good peripheral circulation. Respiratory: Normal respiratory effort.  No retractions. Lungs CTAB. Gastrointestinal: Soft and nontender. No distention. Normoactive bowel sounds in all quadrants. Musculoskeletal: Full ROM in all extremities without pain or difficulty. Neurologic:  Normal speech and language. No gross focal neurologic deficits are appreciated.  Skin:  Skin is warm, dry and intact. No rash noted. Psychiatric: Mood and affect are normal. Speech and behavior are normal.  ____________________________________________   LABS (all labs ordered are listed, but only abnormal results are displayed)  Labs Reviewed  BASIC METABOLIC PANEL - Abnormal; Notable for the following:       Result Value   Calcium 8.3 (*)    All other components within normal limits  CBC WITH DIFFERENTIAL/PLATELET - Abnormal; Notable for the following:    WBC 12.6 (*)    Neutro Abs 6.9 (*)    Lymphs Abs 4.0 (*)    Monocytes Absolute 1.1 (*)    All other components within normal limits  URINALYSIS COMPLETEWITH MICROSCOPIC ([facility] ONLY) - Abnormal; Notable for the following:    Color, Urine YELLOW (*)    APPearance CLEAR (*)    Hgb urine dipstick 1+ (*)    Squamous Epithelial / LPF 0-5 (*)    All other components within normal limits   ____________________________________________  EKG  None. ____________________________________________  RADIOLOGY  CHEST 2 VIEW    COMPARISON: Chest x-ray of 04/09/2013    FINDINGS:  No pneumonia or effusion is seen. Mediastinal and hilar contours are  unremarkable. There is some peribronchial thickening which could  indicate bronchitis. The heart is within normal limits in size. No  bony abnormality is seen.    IMPRESSION:  No active lung disease. Question bronchitis.      Electronically Signed   By: Dwyane DeePaul Barry M.D.  On: 12/23/2015 13:56    ____________________________________________   PROCEDURES  Procedure(s) performed: None  Procedures  Critical Care performed: No  ____________________________________________   INITIAL IMPRESSION / ASSESSMENT AND PLAN / ED COURSE  Pertinent labs & imaging results that were available during my care of the patient were reviewed by me and considered in my medical decision making (see chart for details).  Patient presentation consistent with upper respiratory infection. GI symptoms likely related to taking antibiotic without food. Patient given 500 ml NS in ED as well as 4 mg IV Zofran, tolerated well. Patient to continue amoxicillin, but take with food. Patient given prescriptions for Robitussin AC and Zofran. Patient to follow up with a PCP as needed. No other emergency medicine complaints at this time.   Clinical Course     ____________________________________________   FINAL CLINICAL IMPRESSION(S) / ED DIAGNOSES  Final diagnoses:  Upper respiratory tract infection, unspecified type      NEW MEDICATIONS STARTED DURING THIS VISIT:  Discharge Medication List as of 12/23/2015  3:04 PM    START taking these medications   Details  guaiFENesin-codeine 100-10 MG/5ML syrup Take 10 mLs  by mouth 3 (three) times daily as needed for cough., Starting Thu 12/23/2015, Print    ondansetron (ZOFRAN) 4 MG tablet Take 1 tablet (4 mg total) by mouth daily as needed for nausea or vomiting., Starting Thu 12/23/2015, Until Fri 12/22/2016, Print         Note:  This document was prepared using Dragon voice recognition software and may include unintentional dictation errors.   Evangeline Dakin, PA-C 12/23/15 1826    Jeanmarie Plant, MD 12/23/15 [phone]

## 2015-12-23 NOTE — Discharge Instructions (Signed)
Make sure you are eating with your antibiotics. Taking these on an empty stomach can cause nausea, vomiting and diarrhea.

## 2016-04-27 ENCOUNTER — Encounter: Payer: Self-pay | Admitting: Emergency Medicine

## 2016-04-27 ENCOUNTER — Emergency Department
Admission: EM | Admit: 2016-04-27 | Discharge: 2016-04-27 | Disposition: A | Discharge status: Home / Self Care | Payer: Medicaid Other | Attending: Emergency Medicine | Admitting: Emergency Medicine

## 2016-04-27 DIAGNOSIS — Z791 Long term (current) use of non-steroidal anti-inflammatories (NSAID): Secondary | ICD-10-CM | POA: Diagnosis not present

## 2016-04-27 DIAGNOSIS — F17200 Nicotine dependence, unspecified, uncomplicated: Secondary | ICD-10-CM | POA: Diagnosis not present

## 2016-04-27 DIAGNOSIS — R21 Rash and other nonspecific skin eruption: Secondary | ICD-10-CM | POA: Diagnosis present

## 2016-04-27 DIAGNOSIS — B369 Superficial mycosis, unspecified: Secondary | ICD-10-CM | POA: Diagnosis not present

## 2016-04-27 DIAGNOSIS — L03311 Cellulitis of abdominal wall: Secondary | ICD-10-CM | POA: Insufficient documentation

## 2016-04-27 DIAGNOSIS — Z79899 Other long term (current) drug therapy: Secondary | ICD-10-CM | POA: Diagnosis not present

## 2016-04-27 DIAGNOSIS — L0390 Cellulitis, unspecified: Secondary | ICD-10-CM

## 2016-04-27 LAB — CBC
HCT: 42.2 % (ref 35.0–47.0)
Hemoglobin: 14.3 g/dL (ref 12.0–16.0)
MCH: 29.8 pg (ref 26.0–34.0)
MCHC: 33.8 g/dL (ref 32.0–36.0)
MCV: 88.1 fL (ref 80.0–100.0)
PLATELETS: 304 10*3/uL (ref 150–440)
RBC: 4.79 MIL/uL (ref 3.80–5.20)
RDW: 12.9 % (ref 11.5–14.5)
WBC: 14.5 10*3/uL — ABNORMAL HIGH (ref 3.6–11.0)

## 2016-04-27 LAB — BASIC METABOLIC PANEL
Anion gap: 7 (ref 5–15)
BUN: 11 mg/dL (ref 6–20)
CALCIUM: 8.7 mg/dL — AB (ref 8.9–10.3)
CHLORIDE: 108 mmol/L (ref 101–111)
CO2: 25 mmol/L (ref 22–32)
CREATININE: 0.74 mg/dL (ref 0.44–1.00)
GFR calc Af Amer: >60 mL/min (ref >60)
Glucose, Bld: 94 mg/dL (ref 65–99)
Potassium: 4.1 mmol/L (ref 3.5–5.1)
SODIUM: 140 mmol/L (ref 135–145)

## 2016-04-27 LAB — POCT PREGNANCY, URINE: Preg Test, Ur: NEGATIVE

## 2016-04-27 MED ORDER — CEPHALEXIN 500 MG PO CAPS: 500.0000 mg | ORAL_CAPSULE | Freq: Four times a day (QID) | ORAL | 0 refills | Status: AC

## 2016-04-27 MED ORDER — NYSTATIN 100000 UNIT/GM EX CREA: 1.0000 "application " | TOPICAL_CREAM | Freq: Two times a day (BID) | CUTANEOUS | 0 refills | Status: DC

## 2016-04-27 NOTE — ED Provider Notes (Signed)
[facility] Emergency Department Provider Note   ____________________________________________   I have reviewed the triage vital signs and the nursing notes.   HISTORY  Chief Complaint Post-op Problem   History limited by: Not Limited   HPI Rebecca Li is a 26 y.o. female who presents to the emergency department today because of concern for rash and bad odor to the side of her previous cesarean section scar. Patient had her cesarean section 3 years ago. She states that yesterday she noticed some redness to that area of her body. She then developed moisture and a bad odor to that area. Patient denies any fevers or nausea or vomiting.   History reviewed. No pertinent past medical history.  Patient Active Problem List   Diagnosis Date Noted  . Calculus of gallbladder with other cholecystitis, without mention of obstruction 02/26/2013  . 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 WRIST PAIN 07/16/2008    Past Surgical History:  Procedure Laterality Date  . CESAREAN SECTION  2006,2011,2014    Prior to Admission medications   Medication Sig Start Date End Date Taking? Authorizing Provider  guaiFENesin-codeine 100-10 MG/5ML syrup Take 10 mLs by mouth 3 (three) times daily as needed for cough. 12/23/15   Charmayne Sheerharles M Beers, PA-C  ibuprofen (ADVIL,MOTRIN) 800 MG tablet Take 1 tablet (800 mg total) by mouth every 8 (eight) hours as needed. 11/29/15   Charmayne Sheerharles M Beers, PA-C  ondansetron (ZOFRAN) 4 MG tablet Take 1 tablet (4 mg total) by mouth daily as needed for nausea or vomiting. 12/23/15 12/22/16  Evangeline Dakinharles M Beers, PA-C    Allergies Sulfamethoxazole-trimethoprim  History reviewed. No pertinent family history.  Social History Social History  Substance Use Topics  . Smoking status: Current Every Day Smoker    Packs/day: 1.00    Years: 3.00  . Smokeless tobacco: Never Used  . Alcohol use No    Review of Systems  Constitutional: Negative for fever. Cardiovascular:  Negative for chest pain. Respiratory: Negative for shortness of breath. Gastrointestinal: Negative for abdominal pain, vomiting and diarrhea. Genitourinary: Negative for dysuria. Musculoskeletal: Negative for back pain. Skin: Redness to old caesarean section scar Neurological: Negative for headaches, focal weakness or numbness.  10-point ROS otherwise negative.  ____________________________________________   PHYSICAL EXAM:  VITAL SIGNS: ED Triage Vitals  Enc Vitals Group     BP 04/27/16 0043 (!) 151/72     Pulse Rate 04/27/16 0043 (!) 108     Resp 04/27/16 0043 18     Temp 04/27/16 0043 98.1 F (36.7 C)     Temp Source 04/27/16 0043 Oral     SpO2 04/27/16 0043 100 %     Weight 04/27/16 0043 240 lb (108.9 kg)     Height 04/27/16 0043 5\' 6"  (1.676 m)     Head Circumference --      Peak Flow --      Pain Score 04/27/16 0044 0    Constitutional: Alert and oriented. Well appearing and in no distress. Eyes: Conjunctivae are normal. Normal extraocular movements. ENT   Head: Normocephalic and atraumatic.   Nose: No congestion/rhinnorhea.   Mouth/Throat: Mucous membranes are moist.   Neck: No stridor. Hematological/Lymphatic/Immunilogical: No cervical lymphadenopathy. Cardiovascular: Normal rate, regular rhythm.  No murmurs, rubs, or gallops. Respiratory: Normal respiratory effort without tachypnea nor retractions. Breath sounds are clear and equal bilaterally. No wheezes/rales/rhonchi. Gastrointestinal: Soft and non tender. No rebound. No guarding.  Genitourinary: Deferred Musculoskeletal: Normal range of motion in all extremities. No lower extremity edema.  Neurologic:  Normal speech and language. No gross focal neurologic deficits are appreciated.  Skin:  Around site of old c section scar, in fold of pannus there is erythema and malodor. No fluctuance.  Psychiatric: Mood and affect are normal. Speech and behavior are normal. Patient exhibits appropriate insight and  judgment.  ____________________________________________    LABS (pertinent positives/negatives)  Labs Reviewed  CBC - Abnormal; Notable for the following:       Result Value   WBC 14.5 (*)    All other components within normal limits  BASIC METABOLIC PANEL - Abnormal; Notable for the following:    Calcium 8.7 (*)    All other components within normal limits  POC URINE PREG, ED  POCT PREGNANCY, URINE     ____________________________________________   EKG  None  ____________________________________________    RADIOLOGY  None  ____________________________________________   PROCEDURES  Procedures  ____________________________________________   INITIAL IMPRESSION / ASSESSMENT AND PLAN / ED COURSE  Pertinent labs & imaging results that were available during my care of the patient were reviewed by me and considered in my medical decision making (see chart for details).  Patient presents to the emergency department today because of concerns for erythema and malodor around site of old cesarean section scar. On exam she does have a fold of pannus and underneath it is consistent with Candida will infection. However I do have some concern for secondary bacterial infection given malodor. Will plan on discharging with fungal cream as well as antibiotics. Discussed with patient here for that area of her skin.  ____________________________________________   FINAL CLINICAL IMPRESSION(S) / ED DIAGNOSES  Final diagnoses:  Cellulitis, unspecified cellulitis site  Fungal infection of skin     Note: This dictation was prepared with Dragon dictation. Any transcriptional errors that result from this process are unintentional     Phineas Semen, MD 04/27/16 [phone]

## 2016-04-27 NOTE — Discharge Instructions (Signed)
Please seek medical attention for any high fevers, chest pain, shortness of breath, change in behavior, persistent vomiting, bloody stool or any other new or concerning symptoms.  

## 2016-04-27 NOTE — ED Notes (Signed)
Pt reports that she had a c-section three years ago and that the scar has became red and started draining today - this nurse looked at area and it appears to be more of an irritation to the skin with some clear fluid oozing from the raw area (chaffing) - area is red and inflamed

## 2016-04-27 NOTE — ED Notes (Signed)
Unsuccessful attempts x 3 for blood work by triage staff, lab called for assistance

## 2016-04-27 NOTE — ED Triage Notes (Signed)
Pt ambulatory to triage in NAD, report c-section 3 years ago, corner of incision site irritated, red, with purulent drainage.

## 2016-06-05 ENCOUNTER — Emergency Department
Admission: EM | Admit: 2016-06-05 | Discharge: 2016-06-05 | Disposition: A | Discharge status: Home / Self Care | Payer: Medicaid Other | Attending: Emergency Medicine | Admitting: Emergency Medicine

## 2016-06-05 ENCOUNTER — Encounter: Payer: Self-pay | Admitting: *Deleted

## 2016-06-05 DIAGNOSIS — R45851 Suicidal ideations: Secondary | ICD-10-CM | POA: Diagnosis present

## 2016-06-05 DIAGNOSIS — F341 Dysthymic disorder: Secondary | ICD-10-CM

## 2016-06-05 DIAGNOSIS — F329 Major depressive disorder, single episode, unspecified: Secondary | ICD-10-CM | POA: Diagnosis not present

## 2016-06-05 DIAGNOSIS — F4325 Adjustment disorder with mixed disturbance of emotions and conduct: Secondary | ICD-10-CM

## 2016-06-05 DIAGNOSIS — Z5181 Encounter for therapeutic drug level monitoring: Secondary | ICD-10-CM | POA: Diagnosis not present

## 2016-06-05 DIAGNOSIS — F17200 Nicotine dependence, unspecified, uncomplicated: Secondary | ICD-10-CM | POA: Diagnosis not present

## 2016-06-05 DIAGNOSIS — F32A Depression, unspecified: Secondary | ICD-10-CM

## 2016-06-05 LAB — CBC
HCT: 42.7 % (ref 35.0–47.0)
Hemoglobin: 14.3 g/dL (ref 12.0–16.0)
MCH: 29.5 pg (ref 26.0–34.0)
MCHC: 33.5 g/dL (ref 32.0–36.0)
MCV: 88.2 fL (ref 80.0–100.0)
PLATELETS: 350 10*3/uL (ref 150–440)
RBC: 4.84 MIL/uL (ref 3.80–5.20)
RDW: 12.8 % (ref 11.5–14.5)
WBC: 11.8 10*3/uL — ABNORMAL HIGH (ref 3.6–11.0)

## 2016-06-05 LAB — COMPREHENSIVE METABOLIC PANEL
ALK PHOS: 109 U/L (ref 38–126)
ALT: 16 U/L (ref 14–54)
AST: 20 U/L (ref 15–41)
Albumin: 3.7 g/dL (ref 3.5–5.0)
Anion gap: 7 (ref 5–15)
BILIRUBIN TOTAL: 0.6 mg/dL (ref 0.3–1.2)
BUN: 11 mg/dL (ref 6–20)
CO2: 27 mmol/L (ref 22–32)
CREATININE: 0.68 mg/dL (ref 0.44–1.00)
Calcium: 8.7 mg/dL — ABNORMAL LOW (ref 8.9–10.3)
Chloride: 105 mmol/L (ref 101–111)
GFR calc Af Amer: >60 mL/min (ref >60)
GFR calc non Af Amer: >60 mL/min (ref >60)
Glucose, Bld: 112 mg/dL — ABNORMAL HIGH (ref 65–99)
Potassium: 3.9 mmol/L (ref 3.5–5.1)
Sodium: 139 mmol/L (ref 135–145)
TOTAL PROTEIN: 7.2 g/dL (ref 6.5–8.1)

## 2016-06-05 LAB — ETHANOL

## 2016-06-05 LAB — URINE DRUG SCREEN, QUALITATIVE (ARMC ONLY)
Amphetamines, Ur Screen: NOT DETECTED
Barbiturates, Ur Screen: NOT DETECTED
Benzodiazepine, Ur Scrn: NOT DETECTED
CANNABINOID 50 NG, UR ~~LOC~~: POSITIVE — AB
Cocaine Metabolite,Ur ~~LOC~~: NOT DETECTED
MDMA (Ecstasy)Ur Screen: NOT DETECTED
Methadone Scn, Ur: NOT DETECTED
Opiate, Ur Screen: NOT DETECTED
PHENCYCLIDINE (PCP) UR S: NOT DETECTED
Tricyclic, Ur Screen: NOT DETECTED

## 2016-06-05 LAB — SALICYLATE LEVEL: Salicylate Lvl: <7 mg/dL (ref 2.8–30.0)

## 2016-06-05 LAB — ACETAMINOPHEN LEVEL: Acetaminophen (Tylenol), Serum: <10 ug/mL — ABNORMAL LOW (ref 10–30)

## 2016-06-05 NOTE — ED Notes (Signed)
Pt in via triage; pt reports telling her peer support via text "I'm gonna kill myself if you don't leave me alone."  Pt denies any HI/SI at this time; states she just wanted to be left alone.  Pt reports recent annivessary of the death of her daughter, reports increased depression since then.  Pt alert, calm, tearful upon assessment.

## 2016-06-05 NOTE — ED Notes (Signed)
Belongings labeled with green and white pt stickers, [facility]

## 2016-06-05 NOTE — Discharge Instructions (Signed)

## 2016-06-05 NOTE — ED Triage Notes (Addendum)
Arrives via EMS, states she sent a text to her peer support person saying she wanted to kill herself, when asked about SI pt states "I just wanted her to leave me alone I dont actually want to do it", denies HI, states hx of self injury behavior, denies any ETOH or drugs  Peer support person Annice PihJackie [phone]

## 2016-06-05 NOTE — ED Provider Notes (Signed)
[facility] Emergency Department Provider Note  ____________________________________________  Time seen: Approximately 2:53 PM  I have reviewed the triage vital signs and the nursing notes.   HISTORY  Chief Complaint Suicidal    HPI Rebecca Li is a 26 y.o. female with a history of depression, off her medication for years, brought by [facility] supporter after she tested that she was going to kill herself. She says is the anniversary of her child's death. She denies any current SI, HI or hallucinations.no medical complaints at this time   History reviewed. No pertinent past medical history.  Patient Active Problem List   Diagnosis Date Noted  . Calculus of gallbladder with other cholecystitis, without mention of obstruction 02/26/2013  . 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 WRIST PAIN 07/16/2008    Past Surgical History:  Procedure Laterality Date  . CESAREAN SECTION  2006,2011,2014    Current Outpatient Rx  . Order #: [id number] Class: Print  . Order #: [id number] Class: Print  . Order #: [id number] Class: Print  . Order #: [id number] Class: Print    Allergies Sulfamethoxazole-trimethoprim  History reviewed. No pertinent family history.  Social History Social History  Substance Use Topics  . Smoking status: Current Every Day Smoker    Packs/day: 1.00    Years: 3.00  . Smokeless tobacco: Never Used  . Alcohol use No    Review of Systems Constitutional: No fever/chills. Eyes: No visual changes. ENT: No sore throat. No congestion or rhinorrhea. Cardiovascular: Denies chest pain. Denies palpitations. Respiratory: Denies shortness of breath.  No cough. Gastrointestinal: No abdominal pain.  No nausea, no vomiting.  No diarrhea.  No constipation. Genitourinary: Negative for dysuria. Musculoskeletal: Negative for back pain. Skin: Negative for rash. Neurological: Negative for headaches. No focal numbness, tingling or weakness.  Psychiatric:Positive  feeling sad but no SI, HI or hallucinations on my examination.  10-point ROS otherwise negative.  ____________________________________________   PHYSICAL EXAM:  VITAL SIGNS: ED Triage Vitals [06/05/16 1338]  Enc Vitals Group     BP (!) 141/105     Pulse Rate 93     Resp 18     Temp 98.9 F (37.2 C)     Temp Source Oral     SpO2 96 %     Weight 250 lb (113.4 kg)     Height 5\' 5"  (1.651 m)     Head Circumference      Peak Flow      Pain Score      Pain Loc      Pain Edu?      Excl. in GC?     Constitutional: Alert and oriented. Well appearing and in no acute distress. Answers questions appropriately. Eyes: Conjunctivae are normal.  EOMI. No scleral icterus. Head: Atraumatic. Nose: No congestion/rhinnorhea. Mouth/Throat: Mucous membranes are moist.  Neck: No stridor.  Supple.   Cardiovascular: Normal rate, regular rhythm. No murmurs, rubs or gallops.  Respiratory: Normal respiratory effort.  No accessory muscle use or retractions. Lungs CTAB.  No wheezes, rales or ronchi. Gastrointestinal: Soft, nontender and nondistended.  No guarding or rebound.  No peritoneal signs. Musculoskeletal: No LE edema. No ttp in the calves or palpable cords.  Negative Homan's sign. Neurologic:  A&Ox3.  Speech is clear.  Face and smile are symmetric.  EOMI.  Moves all extremities well. Skin:  Skin is warm, dry and intact. No rash noted. Psychiatric: Depressed mood and flat affect. Tearful on my examination. Normal speech but is not pressured   ____________________________________________  LABS (all labs ordered are listed, but only abnormal results are displayed)  Labs Reviewed  COMPREHENSIVE METABOLIC PANEL - Abnormal; Notable for the following:       Result Value   Glucose, Bld 112 (*)    Calcium 8.7 (*)    All other components within normal limits  ACETAMINOPHEN LEVEL - Abnormal; Notable for the following:    Acetaminophen (Tylenol), Serum <10 (*)    All other components within  normal limits  CBC - Abnormal; Notable for the following:    WBC 11.8 (*)    All other components within normal limits  ETHANOL  SALICYLATE LEVEL  URINE DRUG SCREEN, QUALITATIVE ([facility] ONLY)   ____________________________________________  EKG   Not indicated ____________________________________________  RADIOLOGY  No results found.  ____________________________________________   PROCEDURES  Procedure(s) performed: None  Procedures  Critical Care performed: No ____________________________________________   INITIAL IMPRESSION / ASSESSMENT AND PLAN / ED COURSE  Pertinent labs & imaging results that were available during my care of the patient were reviewed by me and considered in my medical decision making (see chart for details).  26 y.o. female with a history of depression, not currently on any medications, who was brought in because she tested her Jeanella Crazeierce support person that she wanted to kill herself. At this time, she is denying that. She is medical cleared for psychiatric disposition.  ____________________________________________  FINAL CLINICAL IMPRESSION(S) / ED DIAGNOSES  Final diagnoses:  Suicidal ideations  Depression, unspecified depression type         NEW MEDICATIONS STARTED DURING THIS VISIT:  New Prescriptions   No medications on file      Rockne MenghiniAnne-Caroline Lenita Peregrina, MD 06/05/16 1546

## 2016-06-05 NOTE — ED Provider Notes (Addendum)
Clinical Course as of Jun 06 1634  Mon Jun 05, 2016  1635 I spoke in person with Dr. Toni Amendlapacs who evaluated the patient and feels that the patient is safe to be discharged with outpatient follow-up.  The patient is hemodynamically stable and appropriate to go at this time.  [CF]  1636 Per Dr. Toni Amendlapacs, patient does not meet IVC criteria and he has reversed the IVC  [CF]    Clinical Course User Index [CF] Loleta Roseory Glendel Jaggers, MD      Loleta Roseory Kamilia Carollo, MD 06/05/16 1635    Loleta Roseory Letonya Mangels, MD 06/05/16 [phone]

## 2016-06-05 NOTE — ED Notes (Signed)
Dressed out by this Charity fundraiserN, cellphone and wallet placed in belongings bag, clothing in bag

## 2016-06-05 NOTE — Consult Note (Signed)
[facility] Consult   Reason for Consult:  Consult for 26 year old woman with a history of mood instability brought in under commitment filed by her peer support Referring Physician:  Mariea Clonts Patient Identification: Rebecca Li MRN:  [id number] Principal Diagnosis: Adjustment disorder with mixed disturbance of emotions and conduct Diagnosis:   Patient Active Problem List   Diagnosis Date Noted  . Dysthymia [F34.1] 06/05/2016  . Adjustment disorder with mixed disturbance of emotions and conduct [F43.25] 06/05/2016  . Calculus of gallbladder with other cholecystitis, without mention of obstruction [K80.10] 02/26/2013  . ULNAR NEURITIS [G56.20] 07/16/2008  . WRIST PAIN [M25.549] 07/16/2008    Total Time spent with patient: 1 hour  Subjective:   Rebecca Li is a 26 y.o. female patient admitted with "I told my peer worker to leave me alone".  HPI:  Patient interviewed chart reviewed. This is a 26 year old woman was brought in under IVC. She says that her peer support worker At her today. She said that this was a bad day for people to get in touch with her. She was also angry at the peer support worker because she says they had not provided her with any help 2 weeks ago when she really needed them. She says that she made some comments to them including saying that they should go away and that they would probably never see her again. Patient absolutely denies there was any implication of suicide about this. She says her mood is been bad today because yesterday was the anniversary of the death of one of her children. Generally her mood stays here to bowl and dysphoric a lot of the time. Sleep is up and down. Appetite is fine. Patient denies any auditory or visual hallucinations. She is not currently seeing anyone for psychiatric treatment. She says she has tried medicines and none of them helped. Used to see a therapist at [facility] but has not been going recently. She  admits that she smokes marijuana regularly saying that's the only thing that helps to calm her down.  Social history: Patient lives with her fianc and her young son. Had another child who died some time ago. Works regularly at a [employer].  Medical history: Overweight. Otherwise denies any significant ongoing medical problems.  Substance abuse history: Chronic heavy use of marijuana daily denies drugs to denies any other alcohol  Past Psychiatric History: Patient has had a prior psychiatric admission years ago. Denies that she's ever seriously tried to kill her self. Denies any history of violence. Says she's tried multiple medicines without any benefit. No history of psychosis or mania.  Risk to Self:   Risk to Others:   Prior Inpatient Therapy:   Prior Outpatient Therapy:    Past Medical History: History reviewed. No pertinent past medical history.  Past Surgical History:  Procedure Laterality Date  . CESAREAN SECTION  2006,2011,2014   Family History: History reviewed. No pertinent family history. Family Psychiatric  History: She says that her mother has anxiety and depression problems Social History:  History  Alcohol Use No     History  Drug Use No    Social History   Social History  . Marital status: Single    Spouse name: N/A  . Number of children: N/A  . Years of education: N/A   Social History Main Topics  . Smoking status: Current Every Day Smoker    Packs/day: 1.00    Years: 3.00  . Smokeless tobacco: Never Used  .  Alcohol use No  . Drug use: No  . Sexual activity: Yes   Other Topics Concern  . None   Social History Narrative  . None   Additional Social History:    Allergies:   Allergies  Allergen Reactions  . Sulfamethoxazole-Trimethoprim     REACTION: swelling and hives    Labs:  Results for orders placed or performed during the hospital encounter of 06/05/16 (from the past 48 hour(s))  Comprehensive metabolic panel     Status:  Abnormal   Collection Time: 06/05/16  1:40 PM  Result Value Ref Range   Sodium 139 135 - 145 mmol/L   Potassium 3.9 3.5 - 5.1 mmol/L   Chloride 105 101 - 111 mmol/L   CO2 27 22 - 32 mmol/L   Glucose, Bld 112 (H) 65 - 99 mg/dL   BUN 11 6 - 20 mg/dL   Creatinine, Ser 0.68 0.44 - 1.00 mg/dL   Calcium 8.7 (L) 8.9 - 10.3 mg/dL   Total Protein 7.2 6.5 - 8.1 g/dL   Albumin 3.7 3.5 - 5.0 g/dL   AST 20 15 - 41 U/L   ALT 16 14 - 54 U/L   Alkaline Phosphatase 109 38 - 126 U/L   Total Bilirubin 0.6 0.3 - 1.2 mg/dL   GFR calc non Af Amer >60 >60 mL/min   GFR calc Af Amer >60 >60 mL/min    Comment: (NOTE) The eGFR has been calculated using the CKD EPI equation. This calculation has not been validated in all clinical situations. eGFR's persistently <60 mL/min signify possible Chronic Kidney Disease.    Anion gap 7 5 - 15  Ethanol     Status: None   Collection Time: 06/05/16  1:40 PM  Result Value Ref Range   Alcohol, Ethyl (B) <5 <5 mg/dL    Comment:        LOWEST DETECTABLE LIMIT FOR SERUM ALCOHOL IS 5 mg/dL FOR MEDICAL PURPOSES ONLY   Salicylate level     Status: None   Collection Time: 06/05/16  1:40 PM  Result Value Ref Range   Salicylate Lvl <3.7 2.8 - 30.0 mg/dL  Acetaminophen level     Status: Abnormal   Collection Time: 06/05/16  1:40 PM  Result Value Ref Range   Acetaminophen (Tylenol), Serum <10 (L) 10 - 30 ug/mL    Comment:        THERAPEUTIC CONCENTRATIONS VARY SIGNIFICANTLY. A RANGE OF 10-30 ug/mL MAY BE AN EFFECTIVE CONCENTRATION FOR MANY PATIENTS. HOWEVER, SOME ARE BEST TREATED AT CONCENTRATIONS OUTSIDE THIS RANGE. ACETAMINOPHEN CONCENTRATIONS >150 ug/mL AT 4 HOURS AFTER INGESTION AND >50 ug/mL AT 12 HOURS AFTER INGESTION ARE OFTEN ASSOCIATED WITH TOXIC REACTIONS.   cbc     Status: Abnormal   Collection Time: 06/05/16  1:40 PM  Result Value Ref Range   WBC 11.8 (H) 3.6 - 11.0 K/uL   RBC 4.84 3.80 - 5.20 MIL/uL   Hemoglobin 14.3 12.0 - 16.0 g/dL   HCT  42.7 35.0 - 47.0 %   MCV 88.2 80.0 - 100.0 fL   MCH 29.5 26.0 - 34.0 pg   MCHC 33.5 32.0 - 36.0 g/dL   RDW 12.8 11.5 - 14.5 %   Platelets 350 150 - 440 K/uL  Urine Drug Screen, Qualitative     Status: Abnormal   Collection Time: 06/05/16  1:40 PM  Result Value Ref Range   Tricyclic, Ur Screen NONE DETECTED NONE DETECTED   Amphetamines, Ur Screen NONE DETECTED NONE DETECTED  MDMA (Ecstasy)Ur Screen NONE DETECTED NONE DETECTED   Cocaine Metabolite,Ur [place] NONE DETECTED NONE DETECTED   Opiate, Ur Screen NONE DETECTED NONE DETECTED   Phencyclidine (PCP) Ur S NONE DETECTED NONE DETECTED   Cannabinoid 50 Ng, Ur  POSITIVE (A) NONE DETECTED   Barbiturates, Ur Screen NONE DETECTED NONE DETECTED   Benzodiazepine, Ur Scrn NONE DETECTED NONE DETECTED   Methadone Scn, Ur NONE DETECTED NONE DETECTED    Comment: (NOTE) 872  Tricyclics, urine               Cutoff 1000 ng/mL 200  Amphetamines, urine             Cutoff 1000 ng/mL 300  MDMA (Ecstasy), urine           Cutoff 500 ng/mL 400  Cocaine Metabolite, urine       Cutoff 300 ng/mL 500  Opiate, urine                   Cutoff 300 ng/mL 600  Phencyclidine (PCP), urine      Cutoff 25 ng/mL 700  Cannabinoid, urine              Cutoff 50 ng/mL 800  Barbiturates, urine             Cutoff 200 ng/mL 900  Benzodiazepine, urine           Cutoff 200 ng/mL 1000 Methadone, urine                Cutoff 300 ng/mL 1100 1200 The urine drug screen provides only a preliminary, unconfirmed 1300 analytical test result and should not be used for non-medical 1400 purposes. Clinical consideration and professional judgment should 1500 be applied to any positive drug screen result due to possible 1600 interfering substances. A more specific alternate chemical method 1700 must be used in order to obtain a confirmed analytical result.  1800 Gas chromato graphy / mass spectrometry (GC/MS) is the preferred 1900 confirmatory method.     No current  facility-administered medications for this encounter.    Current Outpatient Prescriptions  Medication Sig Dispense Refill  . guaiFENesin-codeine 100-10 MG/5ML syrup Take 10 mLs by mouth 3 (three) times daily as needed for cough. 120 mL 0  . ibuprofen (ADVIL,MOTRIN) 800 MG tablet Take 1 tablet (800 mg total) by mouth every 8 (eight) hours as needed. 30 tablet 0  . nystatin cream (MYCOSTATIN) Apply 1 application topically 2 (two) times daily. 30 g 0  . ondansetron (ZOFRAN) 4 MG tablet Take 1 tablet (4 mg total) by mouth daily as needed for nausea or vomiting. 30 tablet 1    Musculoskeletal: Strength & Muscle Tone: within normal limits Gait & Station: normal Patient leans: N/A  Psychiatric Specialty Exam: Physical Exam  Nursing note and vitals reviewed. Constitutional: She appears well-developed and well-nourished.  HENT:  Head: Normocephalic and atraumatic.  Eyes: Conjunctivae are normal. Pupils are equal, round, and reactive to light.  Neck: Normal range of motion.  Cardiovascular: Regular rhythm and normal heart sounds.   Respiratory: Effort normal. No respiratory distress.  GI: Soft.  Musculoskeletal: Normal range of motion.  Neurological: She is alert.  Skin: Skin is warm and dry.  Psychiatric: Her speech is normal and behavior is normal. Thought content normal. Her mood appears anxious. She expresses impulsivity. She exhibits a depressed mood.    Review of Systems  Constitutional: Negative.   HENT: Negative.   Eyes: Negative.   Respiratory: Negative.  Cardiovascular: Negative.   Gastrointestinal: Negative.   Musculoskeletal: Negative.   Skin: Negative.   Neurological: Negative.   Psychiatric/Behavioral: Positive for depression and substance abuse. Negative for hallucinations, memory loss and suicidal ideas. The patient is not nervous/anxious and does not have insomnia.     Blood pressure 132/78, pulse 78, temperature 98.4 F (36.9 C), temperature source Oral, resp.  rate 16, height 5' 5"  (1.651 m), weight 113.4 kg (250 lb), SpO2 98 %.Body mass index is 41.6 kg/m.  General Appearance: Casual  Eye Contact:  Good  Speech:  Clear and Coherent  Volume:  Normal  Mood:  Anxious  Affect:  Congruent  Thought Process:  Goal Directed  Orientation:  Full (Time, Place, and Person)  Thought Content:  Logical  Suicidal Thoughts:  No  Homicidal Thoughts:  No  Memory:  Immediate;   Fair Recent;   Fair Remote;   Fair  Judgement:  Fair  Insight:  Fair  Psychomotor Activity:  Decreased  Concentration:  Concentration: Fair  Recall:  [employer] of Knowledge:  Fair  Language:  Fair  Akathisia:  No  Handed:  Right  AIMS (if indicated):     Assets:  Communication Skills Desire for Improvement Financial Resources/Insurance Housing Physical Health Resilience Social Support  ADL's:  Intact  Cognition:  WNL  Sleep:        Treatment Plan Summary: Daily contact with patient to assess and evaluate symptoms and progress in treatment, Medication management and Plan Patient is denying suicidal ideation. No evidence of attempts to harm self. No psychosis. Patient does not meet commitment criteria. She will be discharged from the hospital and is encouraged to go back to [facility] and will also be given referral to [facility] for outpatient therapy. Case reviewed with emergency room physician and TTS.  Disposition: Patient does not meet criteria for psychiatric inpatient admission. Supportive therapy provided about ongoing stressors.  Alethia Berthold, MD 06/05/2016 6:05 PM

## 2016-06-22 ENCOUNTER — Emergency Department
Admission: EM | Admit: 2016-06-22 | Discharge: 2016-06-22 | Disposition: A | Discharge status: Home / Self Care | Payer: Medicaid Other | Attending: Emergency Medicine | Admitting: Emergency Medicine

## 2016-06-22 ENCOUNTER — Encounter: Payer: Self-pay | Admitting: Emergency Medicine

## 2016-06-22 ENCOUNTER — Emergency Department: Payer: Medicaid Other

## 2016-06-22 DIAGNOSIS — Y929 Unspecified place or not applicable: Secondary | ICD-10-CM | POA: Insufficient documentation

## 2016-06-22 DIAGNOSIS — S46912A Strain of unspecified muscle, fascia and tendon at shoulder and upper arm level, left arm, initial encounter: Secondary | ICD-10-CM | POA: Diagnosis not present

## 2016-06-22 DIAGNOSIS — X501XXA Overexertion from prolonged static or awkward postures, initial encounter: Secondary | ICD-10-CM | POA: Insufficient documentation

## 2016-06-22 DIAGNOSIS — Y939 Activity, unspecified: Secondary | ICD-10-CM | POA: Diagnosis not present

## 2016-06-22 DIAGNOSIS — Y990 Civilian activity done for income or pay: Secondary | ICD-10-CM | POA: Insufficient documentation

## 2016-06-22 DIAGNOSIS — S4992XA Unspecified injury of left shoulder and upper arm, initial encounter: Secondary | ICD-10-CM | POA: Diagnosis present

## 2016-06-22 DIAGNOSIS — R030 Elevated blood-pressure reading, without diagnosis of hypertension: Secondary | ICD-10-CM | POA: Diagnosis not present

## 2016-06-22 DIAGNOSIS — F17200 Nicotine dependence, unspecified, uncomplicated: Secondary | ICD-10-CM | POA: Diagnosis not present

## 2016-06-22 MED ORDER — NAPROXEN 500 MG PO TABS: 500.0000 mg | ORAL_TABLET | Freq: Two times a day (BID) | ORAL | 0 refills | Status: DC

## 2016-06-22 NOTE — ED Provider Notes (Signed)
[facility] Emergency Department Provider Note  ____________________________________________   First MD Initiated Contact with Patient 06/22/16 1348     (approximate)  I have reviewed the triage vital signs and the nursing notes.   HISTORY  Chief Complaint Shoulder Pain    HPI Rebecca Li is a 26 y.o. female is here complaining of shoulder pain.Patient states that she hasn't been experiencing left shoulder pain for approximately one week. 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 She states that at that time she did not have any pain but gradually began having discomfort. She has been taking Tylenol without any relief. She also has some "numbness and tingling" going down her arm. She denies any other injury. Patient arrived with elevated blood pressure and states that she has never been diagnosed with high blood pressure or taken blood pressure medication. Currently she rates her pain as 6/10.   History reviewed. No pertinent past medical history.  Patient Active Problem List   Diagnosis Date Noted  . Dysthymia 06/05/2016  . 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 Calculus of gallbladder with other cholecystitis, without mention of obstruction 02/26/2013  . 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 WRIST PAIN 07/16/2008    Past Surgical History:  Procedure Laterality Date  . CESAREAN SECTION  2006,2011,2014    Prior to Admission medications   Medication Sig Start Date End Date Taking? Authorizing Provider  naproxen (NAPROSYN) 500 MG tablet Take 1 tablet (500 mg total) by mouth 2 (two) times daily with a meal. 06/22/16   Tommi Rumps, PA-C  nystatin cream (MYCOSTATIN) Apply 1 application topically 2 (two) times daily. 04/27/16   Phineas Semen, MD    Allergies Sulfamethoxazole-trimethoprim  No family history on file.  Social History Social History    Substance Use Topics  . Smoking status: Current Every Day Smoker    Packs/day: 1.00    Years: 3.00  . Smokeless tobacco: Never Used  . Alcohol use No    Review of Systems Constitutional: No fever/chills Cardiovascular: Denies chest pain. Respiratory: Denies shortness of breath. Gastrointestinal: No abdominal pain.  No nausea, no vomiting.   Musculoskeletal: Positive for left shoulder pain. Positive for radicular pain left shoulder. Skin: Negative for rash. Neurological: Negative for headaches, focal weakness or numbness.  10-point ROS otherwise negative.  ____________________________________________   PHYSICAL EXAM:  VITAL SIGNS: ED Triage Vitals  Enc Vitals Group     BP 06/22/16 1322 (!) 143/109     Pulse Rate 06/22/16 1322 99     Resp 06/22/16 1322 20     Temp 06/22/16 1322 98.8 F (37.1 C)     Temp Source 06/22/16 1322 Oral     SpO2 06/22/16 1322 99 %     Weight 06/22/16 1322 260 lb (117.9 kg)     Height 06/22/16 1322  (1.651 m)     Head Circumference --      Peak Flow --      Pain Score 06/22/16 1321 6     Pain Loc --      Pain Edu? --      Excl. in GC? --     Constitutional: Alert and oriented. Well appearing and in no acute distress. Eyes: Conjunctivae are normal. PERRL. EOMI. Head: Atraumatic. Nose: No congestion/rhinnorhea. Neck: No stridor.   Cardiovascular: Normal rate, regular rhythm. Grossly normal heart sounds.  Good peripheral  circulation. Respiratory: Normal respiratory effort.  No retractions. Lungs CTAB. Gastrointestinal: Soft and nontender. Obese. Musculoskeletal: Examination of left shoulder there is no gross deformity and no soft tissue swelling. There is diffuse generalized tenderness on palpation. Range of motion is restricted secondary to discomfort. Motor sensory function intact. Skin is warm and dry to touch. Pulses positive. Neurologic:  Normal speech and language. No gross focal neurologic deficits are appreciated. No gait  instability. Skin:  Skin is warm, dry and intact. No rash noted. Psychiatric: Mood and affect are normal. Speech and behavior are normal.  ____________________________________________   LABS (all labs ordered are listed, but only abnormal results are displayed)  Labs Reviewed - No data to display  RADIOLOGY X-ray of the shoulder per radiologist is negative for acute abnormalities. I, Tommi Rumps, personally viewed and evaluated these images (plain radiographs) as part of my medical decision making, as well as reviewing the written report by the radiologist.  ____________________________________________   PROCEDURES  Procedure(s) performed: None  Procedures  Critical Care performed: No  ____________________________________________   INITIAL IMPRESSION / ASSESSMENT AND PLAN / ED COURSE  Pertinent labs & imaging results that were available during my care of the patient were reviewed by me and considered in my medical decision making (see chart for details).  Patient was made aware that there was no acute abnormality noted on her x-ray. Patient has been taking Tylenol at home without any relief. Patient was given a prescription for naproxen 500 mg twice a day with food. She is to follow-up with [facility]  clinic acute-care if any continued problems.      ____________________________________________   FINAL CLINICAL IMPRESSION(S) / ED DIAGNOSES  Final diagnoses:  Strain of left shoulder, initial encounter  Elevated blood-pressure reading without diagnosis of hypertension      NEW MEDICATIONS STARTED DURING THIS VISIT:  New Prescriptions   NAPROXEN (NAPROSYN) 500 MG TABLET    Take 1 tablet (500 mg total) by mouth 2 (two) times daily with a meal.     Note:  This document was prepared using Dragon voice recognition software and may include unintentional dictation errors.    Tommi Rumps, PA-C 06/22/16 1529    Merrily Brittle, MD 06/22/16 [phone]

## 2016-06-22 NOTE — Discharge Instructions (Signed)
Follow-up with [facility] clinic for recheck of your blood pressure. Today your blood pressure was elevated at  143/109.  Naproxen 500 mg twice a day with food.

## 2016-06-22 NOTE — ED Notes (Signed)
Left shoulder pain. No obvious deformity. +2 redial pulse.

## 2016-06-22 NOTE — ED Triage Notes (Signed)
Pt in via POV with complaints of left shoulder pain x approximately one week.  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  Pt reports worsening shoulder pain since then with some numbness/tingling down left arm.  Pt with equal radial pulses.  NAD noted at this time.

## 2017-07-17 ENCOUNTER — Encounter: Payer: Self-pay | Admitting: Obstetrics and Gynecology

## 2017-07-18 ENCOUNTER — Ambulatory Visit: Payer: Self-pay | Admitting: Obstetrics and Gynecology

## 2017-09-27 ENCOUNTER — Ambulatory Visit: Payer: Medicaid Other | Admitting: Advanced Practice Midwife

## 2018-01-18 ENCOUNTER — Emergency Department
Admission: EM | Admit: 2018-01-18 | Discharge: 2018-01-18 | Disposition: A | Discharge status: Home / Self Care | Payer: Medicaid Other | Attending: Emergency Medicine | Admitting: Emergency Medicine

## 2018-01-18 ENCOUNTER — Encounter: Payer: Self-pay | Admitting: Emergency Medicine

## 2018-01-18 ENCOUNTER — Emergency Department: Payer: Medicaid Other

## 2018-01-18 ENCOUNTER — Other Ambulatory Visit: Payer: Self-pay

## 2018-01-18 DIAGNOSIS — F17200 Nicotine dependence, unspecified, uncomplicated: Secondary | ICD-10-CM | POA: Insufficient documentation

## 2018-01-18 DIAGNOSIS — N200 Calculus of kidney: Secondary | ICD-10-CM

## 2018-01-18 DIAGNOSIS — R1012 Left upper quadrant pain: Secondary | ICD-10-CM | POA: Diagnosis present

## 2018-01-18 DIAGNOSIS — N12 Tubulo-interstitial nephritis, not specified as acute or chronic: Secondary | ICD-10-CM | POA: Insufficient documentation

## 2018-01-18 LAB — CBC
HCT: 43.6 % (ref 36.0–46.0)
Hemoglobin: 14.8 g/dL (ref 12.0–15.0)
MCH: 30.1 pg (ref 26.0–34.0)
MCHC: 33.9 g/dL (ref 30.0–36.0)
MCV: 88.6 fL (ref 80.0–100.0)
Platelets: 292 10*3/uL (ref 150–400)
RBC: 4.92 MIL/uL (ref 3.87–5.11)
RDW: 11.9 % (ref 11.5–15.5)
WBC: 21.6 10*3/uL — AB (ref 4.0–10.5)
nRBC: 0 % (ref 0.0–0.2)

## 2018-01-18 LAB — COMPREHENSIVE METABOLIC PANEL
ALK PHOS: 113 U/L (ref 38–126)
ALT: 14 U/L (ref 0–44)
ANION GAP: 9 (ref 5–15)
AST: 19 U/L (ref 15–41)
Albumin: 4.1 g/dL (ref 3.5–5.0)
BUN: 7 mg/dL (ref 6–20)
CALCIUM: 8.8 mg/dL — AB (ref 8.9–10.3)
CHLORIDE: 102 mmol/L (ref 98–111)
CO2: 25 mmol/L (ref 22–32)
Creatinine, Ser: 0.87 mg/dL (ref 0.44–1.00)
Glucose, Bld: 114 mg/dL — ABNORMAL HIGH (ref 70–99)
Potassium: 4 mmol/L (ref 3.5–5.1)
Sodium: 136 mmol/L (ref 135–145)
Total Bilirubin: 1.8 mg/dL — ABNORMAL HIGH (ref 0.3–1.2)
Total Protein: 7.8 g/dL (ref 6.5–8.1)

## 2018-01-18 LAB — URINALYSIS, COMPLETE (UACMP) WITH MICROSCOPIC
Bilirubin Urine: NEGATIVE
GLUCOSE, UA: NEGATIVE mg/dL
Ketones, ur: 20 mg/dL — AB
Nitrite: NEGATIVE
PROTEIN: NEGATIVE mg/dL
Specific Gravity, Urine: 1.006 (ref 1.005–1.030)
pH: 6 (ref 5.0–8.0)

## 2018-01-18 LAB — POCT PREGNANCY, URINE: Preg Test, Ur: NEGATIVE

## 2018-01-18 LAB — LIPASE, BLOOD: Lipase: 26 U/L (ref 11–51)

## 2018-01-18 MED ORDER — CEPHALEXIN 500 MG PO CAPS: 500.0000 mg | ORAL_CAPSULE | Freq: Four times a day (QID) | ORAL | 0 refills | Status: AC

## 2018-01-18 MED ORDER — ONDANSETRON HCL 4 MG/2ML IJ SOLN
4.0000 mg | Freq: Once | INTRAMUSCULAR | Status: AC
  Administered 2018-01-18: 4 mg via INTRAVENOUS
  Filled 2018-01-18: qty 2

## 2018-01-18 MED ORDER — ONDANSETRON 4 MG PO TBDP: 4.0000 mg | ORAL_TABLET | Freq: Three times a day (TID) | ORAL | 0 refills | Status: DC | PRN

## 2018-01-18 MED ORDER — SODIUM CHLORIDE 0.9 % IV BOLUS
1000.0000 mL | Freq: Once | INTRAVENOUS | Status: AC
  Administered 2018-01-18: 1000 mL via INTRAVENOUS

## 2018-01-18 MED ORDER — SODIUM CHLORIDE 0.9 % IV SOLN
1.0000 g | Freq: Once | INTRAVENOUS | Status: AC
  Administered 2018-01-18: 1 g via INTRAVENOUS
  Filled 2018-01-18: qty 10

## 2018-01-18 MED ORDER — KETOROLAC TROMETHAMINE 10 MG PO TABS: 10.0000 mg | ORAL_TABLET | Freq: Three times a day (TID) | ORAL | 0 refills | Status: DC | PRN

## 2018-01-18 MED ORDER — KETOROLAC TROMETHAMINE 30 MG/ML IJ SOLN
30.0000 mg | Freq: Once | INTRAMUSCULAR | Status: AC
  Administered 2018-01-18: 30 mg via INTRAVENOUS
  Filled 2018-01-18: qty 1

## 2018-01-18 NOTE — Discharge Instructions (Addendum)
Today you have a kidney stone in your left kidney, but there is no evidence that it is moving her causing you any pain.  Your symptoms are from an infection in your urinary tract.  Please take the entire course of antibiotics, even if you are feeling better.  You may take Tylenol or Toradol for mild to moderate pain.  If you take Toradol, do not take other NSAID medications including Advil, Aleve ibuprofen or Motrin, as this can irritate the lining of your stomach.  Zofran is for nausea and vomiting.  Turn to the emergency department if you develop severe pain, lightheadedness or fainting, inability to keep down fluids, or any other symptoms concerning to you.

## 2018-01-18 NOTE — ED Provider Notes (Signed)
[facility] Emergency Department Provider Note  ____________________________________________  Time seen: Approximately 2:12 PM  I have reviewed the triage vital signs and the nursing notes.   HISTORY  Chief Complaint Flank Pain    HPI CLARETTA KENDRA is a 27 y.o. female, nonpregnant, with a history of recurrent pyelonephritis, morbid obesity, depression, presenting with urinary symptoms, left flank pain, nausea and vomiting.  The patient reports that since yesterday she has noted some blood-tinged urine and has developed left flank pain.  She has not had dysuria or urinary frequency.  Today, she began to have multiple episodes of nausea and vomiting and was not even able to keep down water or ginger ale.  She has not tried anything for her pain.  Past Medical History:  Diagnosis Date  . Major depression   . Morbid obesity [facility])     Patient Active Problem List   Diagnosis Date Noted  . Dysthymia 06/05/2016  . 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 Calculus of gallbladder with other cholecystitis, without mention of obstruction 02/26/2013  . 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 WRIST PAIN 07/16/2008    Past Surgical History:  Procedure Laterality Date  . CESAREAN SECTION  2006,2011,2014  . Nexplanon Insert  05/11/2012, 07/22/2015  . Nexplanon removal  07/22/2015  . WISDOM TOOTH EXTRACTION      Current Outpatient Rx  . Order #: [id number] Class: Print  . Order #: [id number] Class: Print  . Order #: [id number] Class: Print  . Order #: [id number] Class: Print  . Order #: [id number] Class: Print    Allergies Sulfamethoxazole-trimethoprim  Family History  Problem Relation Age of Onset  . Hypertension Mother   . Diabetes Maternal Grandmother   . Hypertension Maternal Grandmother   . Prostate cancer Maternal Grandfather   . Diabetes Paternal Grandmother   . Hypertension Paternal Grandmother     Social History Social  History   Tobacco Use  . Smoking status: Current Every Day Smoker    Packs/day: 1.00    Years: 3.00    Pack years: 3.00  . Smokeless tobacco: Never Used  Substance Use Topics  . Alcohol use: No  . Drug use: No    Review of Systems Constitutional: Positive shaking chills without fever.  Positive general malaise. Eyes: No visual changes. ENT: No sore throat. No congestion or rhinorrhea. Cardiovascular: Denies chest pain. Denies palpitations. Respiratory: Denies shortness of breath.  No cough. Gastrointestinal: Positive left flank pain.  No abdominal pain.  Positive nausea, positive vomiting.  No diarrhea.  No constipation. Genitourinary: Negative for dysuria.  No urinary frequency.  Positive hematuria. Musculoskeletal: Negative for midline back pain. Skin: Negative for rash. Neurological: Negative for headaches. No focal numbness, tingling or weakness.     ____________________________________________   PHYSICAL EXAM:  VITAL SIGNS: ED Triage Vitals  Enc Vitals Group     BP 01/18/18 1312 131/83     Pulse Rate 01/18/18 1312 (!) 104     Resp 01/18/18 1312 16     Temp 01/18/18 1312 98.2 F (36.8 C)     Temp Source 01/18/18 1312 Oral     SpO2 01/18/18 1312 93 %     Weight 01/18/18 1240 245 lb (111.1 kg)     Height 01/18/18 1240 5\' 6"  (1.676 m)     Head Circumference --      Peak Flow --      Pain Score 01/18/18 1240 7     Pain Loc --  Pain Edu? --      Excl. in GC? --     Constitutional: Alert and oriented.  Answers questions appropriately.  Mildly uncomfortable appearing with dry mucous membranes. Eyes: Conjunctivae are normal.  EOMI. No scleral icterus. Head: Atraumatic. Nose: No congestion/rhinnorhea. Mouth/Throat: Mucous membranes are dry.  Neck: No stridor.  Supple.  No meningismus. Cardiovascular: Normal rate, regular rhythm. No murmurs, rubs or gallops.  Respiratory: Normal respiratory effort.  No accessory muscle use or retractions. Lungs CTAB.  No  wheezes, rales or ronchi. Gastrointestinal: Morbidly obese.  Positive left CVA tenderness to palpation.  Soft, nontender and nondistended.  No guarding or rebound.  No peritoneal signs. Musculoskeletal: No LE edema.  Neurologic:  A&Ox3.  Speech is clear.  Face and smile are symmetric.  EOMI.  Moves all extremities well. Skin:  Skin is warm, dry and intact. No rash noted. Psychiatric: Depressed mood and flat affect.  ____________________________________________   LABS (all labs ordered are listed, but only abnormal results are displayed)  Labs Reviewed  COMPREHENSIVE METABOLIC PANEL - Abnormal; Notable for the following components:      Result Value   Glucose, Bld 114 (*)    Calcium 8.8 (*)    Total Bilirubin 1.8 (*)    All other components within normal limits  CBC - Abnormal; Notable for the following components:   WBC 21.6 (*)    All other components within normal limits  URINALYSIS, COMPLETE (UACMP) WITH MICROSCOPIC - Abnormal; Notable for the following components:   Color, Urine YELLOW (*)    APPearance CLEAR (*)    Hgb urine dipstick MODERATE (*)    Ketones, ur 20 (*)    Leukocytes, UA LARGE (*)    Bacteria, UA MANY (*)    All other components within normal limits  URINE CULTURE  LIPASE, BLOOD  POCT PREGNANCY, URINE  POC URINE PREG, ED   ____________________________________________  EKG  Not indicated ____________________________________________  RADIOLOGY  Ct Renal Stone Study  Result Date: 01/18/2018 CLINICAL DATA:  Left flank pain since last evening. Nausea and vomiting. Cholecystectomy. EXAM: CT ABDOMEN AND PELVIS WITHOUT CONTRAST TECHNIQUE: Multidetector CT imaging of the abdomen and pelvis was performed following the standard protocol without IV contrast. COMPARISON:  06/11/2010 CT FINDINGS: Lower chest: Normal heart size. No pericardial effusion. Clear lung bases. Hepatobiliary: Cholecystectomy. No space-occupying mass or biliary dilatation of the liver given  limitations of a noncontrast study. Pancreas: Normal Spleen: Normal Adrenals/Urinary Tract: Normal bilateral adrenal glands. Nonobstructing calculus in the lower pole of the left kidney is identified measuring approximately 10 x 9 x 9 mm. No hydroureteronephrosis. No ureteral calculi. The urinary bladder is slightly thick-walled in appearance, nonspecific but is more likely due to underdistention. A cystitis is not excluded. No definite intraluminal mass or calculus is appreciated. Correlate for cystitis. Stomach/Bowel: Physiologic distention of the stomach. The duodenal sweep and ligament of Treitz are normal. No small bowel obstruction or inflammation. Normal appendix is noted. The distal and terminal ileum are unremarkable. Nonobstructed large bowel without inflammatory change. Probable right colonic spasm accounting for the contracted appearance. Average stool retention is seen. Vascular/Lymphatic: No significant vascular findings are present. No enlarged abdominal or pelvic lymph nodes. Reproductive: Uterus and adnexa are unremarkable. A single surgical staple projects over the left hemipelvis possibly from prior tubal ligation or displaced from the right upper quadrant. Other: No free air nor free fluid. Musculoskeletal: No acute or significant osseous findings. IMPRESSION: Nonobstructing 10 x 9 x 9 mm calculus in the  lower pole the left kidney. No obstructive uropathy. Mild thick-walled appearance of the urinary bladder is likely due to underdistention. Cystitis is not entirely excluded. This can be correlated clinically. Electronically Signed   By: Tollie Eth M.D.   On: 01/18/2018 14:36    ____________________________________________   PROCEDURES  Procedure(s) performed: None  Procedures  Critical Care performed: No ____________________________________________   INITIAL IMPRESSION / ASSESSMENT AND PLAN / ED COURSE  Pertinent labs & imaging results that were available during my care of the  patient were reviewed by me and considered in my medical decision making (see chart for details).  27 y.o. female, nonpregnant, presenting with left flank pain and hematuria.  Overall, the patient is afebrile.  I am concerned about pyelonephritis versus renal colic.  A CT has been ordered.  The patient's urinalysis does show UTI and she has a white blood cell count of 21.6.  Ceftriaxone has been ordered, as well as 2 L of intravenous fluids.  The patient's creatinine is reassuring.  She will receive medications for symptomatic treatment.  Plan reevaluation for final disposition.  ----------------------------------------- 2:59 PM on 01/18/2018 -----------------------------------------  Patient CT scan shows a nonobstructing stone on the left side.  I will plan to proceed with discharge and outpatient treatment for the patient's pyelonephritis.  She will be discharged with a 10-day course of Keflex, as well as symptomatic treatment.  The patient will follow up with her primary care physician.  She notes that a urine culture has been sent.  Follow-up instructions as well as return precautions have been discussed.  ____________________________________________  FINAL CLINICAL IMPRESSION(S) / ED DIAGNOSES  Final diagnoses:  Left nephrolithiasis  Pyelonephritis         NEW MEDICATIONS STARTED DURING THIS VISIT:  New Prescriptions   CEPHALEXIN (KEFLEX) 500 MG CAPSULE    Take 1 capsule (500 mg total) by mouth 4 (four) times daily for 10 days.   KETOROLAC (TORADOL) 10 MG TABLET    Take 1 tablet (10 mg total) by mouth every 8 (eight) hours as needed for moderate pain (with food).   ONDANSETRON (ZOFRAN ODT) 4 MG DISINTEGRATING TABLET    Take 1 tablet (4 mg total) by mouth every 8 (eight) hours as needed for nausea or vomiting.      Rockne Menghini, MD 01/18/18 1500

## 2018-01-18 NOTE — ED Triage Notes (Signed)
Flank pain left side.  Says she has chills and sweats.  Also vomiting.

## 2018-01-21 LAB — URINE CULTURE: Culture: >=100000 — AB

## 2018-01-22 NOTE — Progress Notes (Signed)
ED Antimicrobial Stewardship Positive Culture Follow Up   Rebecca Li is an 27 y.o. female who presented to [facility] on 01/18/2018 with a chief complaint of  Chief Complaint  Patient presents with  . Flank Pain    Recent Results (from the past 720 hour(s))  Urine culture     Status: Abnormal   Collection Time: 01/18/18 12:45 PM  Result Value Ref Range Status   Specimen Description   Final    URINE, RANDOM Performed at [facility], [address] [place], [place] [address]    Special Requests   Final    NONE Performed at [facility], [address] [place], [place] [address]    Culture >=100,000 COLONIES/mL ESCHERICHIA COLI (A)  Final   Report Status 01/21/2018 FINAL  Final   Organism ID, Bacteria ESCHERICHIA COLI (A)  Final      Susceptibility   Escherichia coli - MIC*    AMPICILLIN >=32 RESISTANT Resistant     CEFAZOLIN >=64 RESISTANT Resistant     CEFTRIAXONE <=1 SENSITIVE Sensitive     CIPROFLOXACIN <=0.25 SENSITIVE Sensitive     GENTAMICIN <=1 SENSITIVE Sensitive     IMIPENEM <=0.25 SENSITIVE Sensitive     NITROFURANTOIN <=16 SENSITIVE Sensitive     TRIMETH/SULFA <=20 SENSITIVE Sensitive     AMPICILLIN/SULBACTAM 16 INTERMEDIATE Intermediate     PIP/TAZO 8 SENSITIVE Sensitive     Extended ESBL NEGATIVE Sensitive     * >=100,000 COLONIES/mL ESCHERICHIA COLI    [x]  Treated with Cephalexin, organism resistant to prescribed antimicrobial   New antibiotic prescription: Cipro 500mg  bid for 7 days  Patient was called and notified of the change in therapy 11/5 1:15pm Patient verbalized an understanding of the change in therapy and the reason the therapy was changed.  Patient expressed that she is still having some back pain.  ED Provider: Sharman Cheek  Prescription called in to [facility] [phone],  [address] [place], [place] Spoke with Zella Ball and left verbal prescription 01/22/18 1:20pm  [name] 01/22/2018, 1:20  PM Clinical Pharmacist

## 2018-04-26 ENCOUNTER — Encounter: Payer: Self-pay | Admitting: Emergency Medicine

## 2018-04-26 ENCOUNTER — Emergency Department
Admission: EM | Admit: 2018-04-26 | Discharge: 2018-04-26 | Disposition: A | Discharge status: Home / Self Care | Payer: Medicaid Other | Attending: Emergency Medicine | Admitting: Emergency Medicine

## 2018-04-26 DIAGNOSIS — J111 Influenza due to unidentified influenza virus with other respiratory manifestations: Secondary | ICD-10-CM | POA: Insufficient documentation

## 2018-04-26 DIAGNOSIS — F17200 Nicotine dependence, unspecified, uncomplicated: Secondary | ICD-10-CM | POA: Insufficient documentation

## 2018-04-26 DIAGNOSIS — H7292 Unspecified perforation of tympanic membrane, left ear: Secondary | ICD-10-CM | POA: Diagnosis not present

## 2018-04-26 DIAGNOSIS — H9202 Otalgia, left ear: Secondary | ICD-10-CM | POA: Diagnosis present

## 2018-04-26 DIAGNOSIS — R69 Illness, unspecified: Secondary | ICD-10-CM

## 2018-04-26 MED ORDER — HYDROCODONE-HOMATROPINE 5-1.5 MG/5ML PO SYRP: 5.0000 mL | ORAL_SOLUTION | Freq: Four times a day (QID) | ORAL | 0 refills | Status: DC | PRN

## 2018-04-26 MED ORDER — ACETAMINOPHEN 325 MG PO TABS
650.0000 mg | ORAL_TABLET | Freq: Once | ORAL | Status: AC
  Administered 2018-04-26: 650 mg via ORAL
  Filled 2018-04-26: qty 2

## 2018-04-26 NOTE — ED Provider Notes (Signed)
[facility] Emergency Department Provider Note  ____________________________________________   First MD Initiated Contact with Patient 04/26/18 1925     (approximate)  I have reviewed the triage vital signs and the nursing notes.   HISTORY  Chief Complaint Cough; Fever; and Generalized Body Aches    HPI Rebecca Li is a 28 y.o. female flulike symptoms, patient is complained of fever, chills, body aches.  cough, sore throat, vomiting, diarrhea, and severe ear pain denies chest pain or sob.  Sx for 7 days.  Patient states she was seen at the urgent care earlier today and they gave her an antibiotic for her ear infection.  She states she came here because her chest still hurts when she coughs.   Past Medical History:  Diagnosis Date  . Major depression   . Morbid obesity [facility])     Patient Active Problem List   Diagnosis Date Noted  . Dysthymia 06/05/2016  . 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 Calculus of gallbladder with other cholecystitis, without mention of obstruction 02/26/2013  . 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 WRIST PAIN 07/16/2008    Past Surgical History:  Procedure Laterality Date  . CESAREAN SECTION  2006,2011,2014  . Nexplanon Insert  05/11/2012, 07/22/2015  . Nexplanon removal  07/22/2015  . WISDOM TOOTH EXTRACTION      Prior to Admission medications   Medication Sig Start Date End Date Taking? Authorizing Provider  HYDROcodone-homatropine (HYCODAN) 5-1.5 MG/5ML syrup Take 5 mLs by mouth every 6 (six) hours as needed. 04/26/18   Sherrie Mustache Roselyn Bering, PA-C    Allergies Sulfamethoxazole-trimethoprim and Coconut oil  Family History  Problem Relation Age of Onset  . Hypertension Mother   . Diabetes Maternal Grandmother   . Hypertension Maternal Grandmother   . Prostate cancer Maternal Grandfather   . Diabetes Paternal Grandmother   . Hypertension Paternal Grandmother     Social  History Social History   Tobacco Use  . Smoking status: Current Every Day Smoker    Packs/day: 1.00    Years: 3.00    Pack years: 3.00  . Smokeless tobacco: Never Used  Substance Use Topics  . Alcohol use: No  . Drug use: No    Review of Systems  Constitutional: Positive fever/chills Eyes: No visual changes. ENT: Positive sore throat. Respiratory: Positive cough Genitourinary: Negative for dysuria. Musculoskeletal: Negative for back pain. Skin: Negative for rash.    ____________________________________________   PHYSICAL EXAM:  VITAL SIGNS: ED Triage Vitals  Enc Vitals Group     BP 04/26/18 1713 (!) 155/96     Pulse Rate 04/26/18 1713 (!) 114     Resp 04/26/18 1713 20     Temp 04/26/18 1715 (!) 100.7 F (38.2 C)     Temp Source 04/26/18 1715 Oral     SpO2 04/26/18 1713 98 %     Weight 04/26/18 1713 255 lb (115.7 kg)     Height 04/26/18 1713 5\' 5"  (1.651 m)     Head Circumference --      Peak Flow --      Pain Score 04/26/18 1713 10     Pain Loc --      Pain Edu? --      Excl. in GC? --     Constitutional: Alert and oriented. Well appearing and in no acute distress.  Febrile but nontoxic Eyes: Conjunctivae are normal.  Head: Atraumatic. Ears: The left TM is red and swollen with  a perforation noted, blood is noted in the ear canal, the right TM is pink and swollen  nose: Active congestion/rhinnorhea. Mouth/Throat: Mucous membranes are moist.  Throat is irritated Neck:  supple no lymphadenopathy noted Cardiovascular: Normal rate, regular rhythm. Heart sounds are normal Respiratory: Normal respiratory effort.  No retractions, lungs c t a , cough is wet GU: deferred Musculoskeletal: FROM all extremities, warm and well perfused Neurologic:  Normal speech and language.  Skin:  Skin is warm, dry and intact. No rash noted. Psychiatric: Mood and affect are normal. Speech and behavior are normal.  ____________________________________________   LABS (all labs  ordered are listed, but only abnormal results are displayed)  Labs Reviewed - No data to display ____________________________________________   ____________________________________________  RADIOLOGY    ____________________________________________   PROCEDURES  Procedure(s) performed: No  Procedures    ____________________________________________   INITIAL IMPRESSION / ASSESSMENT AND PLAN / ED COURSE  Pertinent labs & imaging results that were available during my care of the patient were reviewed by me and considered in my medical decision making (see chart for details).   Patient is 28 year old female presents emergency department complaining of flulike symptoms.  She was seen at the urgent care earlier today given a prescription for [facility].  She states that Omnicef has not stopped the pain in her chest when she coughs.  That is why she came here tonight.  Physical exam is as above.  Explained to the patient that she is not given the cefdinirf time to work.  That this medication does also cover pneumonia.  She was given a prescription for Hycodan cough syrup.  She is to follow-up with her regular doctor if not better in 3 days.  Return emergency department worsening.  Explained to her that she has influenza and that this does not get better for 7 to 10 days from the onset.  Since she is already on medication that covers her ear infection and covers bacteria that caused pneumonia that she does not need a change in antibiotic at this time.  She is to take Tylenol and ibuprofen.  She was given a work note.  She is discharged stable condition.     As part of my medical decision making, I reviewed the following data within the electronic MEDICAL RECORD NUMBER Nursing notes reviewed and incorporated, Old chart reviewed, Notes from prior ED visits and [place] Controlled Substance Database  ____________________________________________   FINAL CLINICAL IMPRESSION(S) / ED DIAGNOSES  Final  diagnoses:  Tympanic membrane perforation, left  Influenza-like illness      NEW MEDICATIONS STARTED DURING THIS VISIT:  New Prescriptions   HYDROCODONE-HOMATROPINE (HYCODAN) 5-1.5 MG/5ML SYRUP    Take 5 mLs by mouth every 6 (six) hours as needed.     Note:  This document was prepared using Dragon voice recognition software and may include unintentional dictation errors.    Faythe Ghee, PA-C 04/26/18 1950    Schaevitz, Myra Rude, MD 04/26/18 [phone]

## 2018-04-26 NOTE — ED Triage Notes (Signed)
Pt continues to talk on her cell phone. Pt reports general body aches, fever, cough, congestion and does not feel well for about 1 week. Pt also reports has an ear infection.

## 2018-04-26 NOTE — Discharge Instructions (Signed)
Follow-up with your regular doctor if not better in 3 days.  Also call  ENT for reevaluation of the ruptured tympanic membrane.  Take the medication that you were given at the urgent care.  You have been given cough medication here.  Please use this sparingly as it has a narcotic in it.  Return if worsening.

## 2018-06-10 ENCOUNTER — Telehealth: Payer: Self-pay

## 2018-06-10 NOTE — Telephone Encounter (Signed)
Pt calling to see if it's time to replace bc and to sched if it's time.  [phone]  Pt aware 18yrs from date of insertion is 07/22/18.  Adv it is not going to stop working on that date.  Pt will call to have removed when she is ready.

## 2018-10-15 ENCOUNTER — Ambulatory Visit (HOSPITAL_COMMUNITY)
Admission: EM | Admit: 2018-10-15 | Discharge: 2018-10-15 | Disposition: A | Discharge status: Home / Self Care | Payer: Medicaid Other | Attending: Urgent Care | Admitting: Urgent Care

## 2018-10-15 ENCOUNTER — Encounter (HOSPITAL_COMMUNITY): Payer: Self-pay

## 2018-10-15 ENCOUNTER — Other Ambulatory Visit: Payer: Self-pay

## 2018-10-15 DIAGNOSIS — R030 Elevated blood-pressure reading, without diagnosis of hypertension: Secondary | ICD-10-CM

## 2018-10-15 DIAGNOSIS — M546 Pain in thoracic spine: Secondary | ICD-10-CM | POA: Diagnosis present

## 2018-10-15 DIAGNOSIS — Z9049 Acquired absence of other specified parts of digestive tract: Secondary | ICD-10-CM | POA: Diagnosis present

## 2018-10-15 DIAGNOSIS — R109 Unspecified abdominal pain: Secondary | ICD-10-CM | POA: Diagnosis present

## 2018-10-15 DIAGNOSIS — N10 Acute tubulo-interstitial nephritis: Secondary | ICD-10-CM

## 2018-10-15 DIAGNOSIS — K219 Gastro-esophageal reflux disease without esophagitis: Secondary | ICD-10-CM

## 2018-10-15 LAB — POCT URINALYSIS DIP (DEVICE)
Bilirubin Urine: NEGATIVE
Glucose, UA: NEGATIVE mg/dL
Ketones, ur: NEGATIVE mg/dL
Nitrite: POSITIVE — AB
Protein, ur: NEGATIVE mg/dL
Specific Gravity, Urine: 1.02 (ref 1.005–1.030)
Urobilinogen, UA: 0.2 mg/dL (ref 0.0–1.0)
pH: 7 (ref 5.0–8.0)

## 2018-10-15 MED ORDER — CIPROFLOXACIN HCL 500 MG PO TABS: 500.0000 mg | ORAL_TABLET | Freq: Two times a day (BID) | ORAL | 0 refills | Status: DC

## 2018-10-15 MED ORDER — ESOMEPRAZOLE MAGNESIUM 20 MG PO CPDR: 20.0000 mg | DELAYED_RELEASE_CAPSULE | Freq: Every day | ORAL | 1 refills | Status: DC

## 2018-10-15 MED ORDER — ONDANSETRON 8 MG PO TBDP: 8.0000 mg | ORAL_TABLET | Freq: Three times a day (TID) | ORAL | 0 refills | Status: DC | PRN

## 2018-10-15 MED ORDER — CEFTRIAXONE SODIUM 1 G IJ SOLR
1.0000 g | Freq: Once | INTRAMUSCULAR | Status: AC
  Administered 2018-10-15: 1 g via INTRAMUSCULAR

## 2018-10-15 MED ORDER — CEFTRIAXONE SODIUM 1 G IJ SOLR
INTRAMUSCULAR | Status: AC
  Filled 2018-10-15: qty 10

## 2018-10-15 NOTE — Discharge Instructions (Addendum)

## 2018-10-15 NOTE — ED Triage Notes (Signed)
Pt states she has right side flank pain for 2 months. Pt states the pain has been off and on for that long.

## 2018-10-15 NOTE — ED Provider Notes (Addendum)
MRN: [id number] DOB: 1990-07-30  Subjective:   Rebecca Li is a 28 y.o. female presenting for 55-month history of recurrent intermittent sharp/achy right flank and right lateral thoracic back pain.  Patient reports that she has a longstanding history of this following her cholecystectomy in 2014.  She also gets intermittent episodes of vomiting with her pain, states that it is yellow bile.  She also has a history of kidney stones.   No current facility-administered medications for this encounter.   Current Outpatient Medications:  .  HYDROcodone-homatropine (HYCODAN) 5-1.5 MG/5ML syrup, Take 5 mLs by mouth every 6 (six) hours as needed., Disp: 120 mL, Rfl: 0    Allergies  Allergen Reactions  . Sulfamethoxazole-Trimethoprim Anaphylaxis  . Coconut Oil Rash    Past Medical History:  Diagnosis Date  . Major depression   . Morbid obesity ([place])      Past Surgical History:  Procedure Laterality Date  . CESAREAN SECTION  2006,2011,2014  . Nexplanon Insert  05/11/2012, 07/22/2015  . Nexplanon removal  07/22/2015  . WISDOM TOOTH EXTRACTION      Family History  Problem Relation Age of Onset  . Hypertension Mother   . Diabetes Maternal Grandmother   . Hypertension Maternal Grandmother   . Prostate cancer Maternal Grandfather   . Diabetes Paternal Grandmother   . Hypertension Paternal Grandmother      Review of Systems  Constitutional: Negative for fever and malaise/fatigue.  HENT: Negative for congestion, ear pain, sinus pain and sore throat.   Eyes: Negative for blurred vision, double vision, discharge and redness.  Respiratory: Negative for cough, hemoptysis, shortness of breath and wheezing.   Cardiovascular: Positive for chest pain (consistent with her heartburn especially with burping).  Gastrointestinal: Positive for heartburn, nausea and vomiting. Negative for abdominal pain and diarrhea.  Genitourinary: Negative for dysuria, flank pain and hematuria.  Musculoskeletal:  Negative for myalgias.  Skin: Negative for rash.  Neurological: Negative for dizziness, weakness and headaches.  Psychiatric/Behavioral: Negative for depression and substance abuse.    Objective:   Vitals: BP (!) 156/96 (BP Location: Left Arm)   Pulse (!) 101   Temp 98.7 F (37.1 C) (Oral)   Resp 18   Wt 254 lb (115.2 kg)   SpO2 100%   BMI 42.27 kg/m   Physical Exam Constitutional:      General: She is not in acute distress.    Appearance: Normal appearance. She is well-developed and normal weight. She is not ill-appearing, toxic-appearing or diaphoretic.  HENT:     Head: Normocephalic and atraumatic.     Right Ear: External ear normal.     Left Ear: External ear normal.     Nose: Nose normal.     Mouth/Throat:     Mouth: Mucous membranes are moist.     Pharynx: Oropharynx is clear.  Eyes:     General: No scleral icterus.    Extraocular Movements: Extraocular movements intact.     Pupils: Pupils are equal, round, and reactive to light.  Cardiovascular:     Rate and Rhythm: Normal rate and regular rhythm.     Heart sounds: Normal heart sounds. No murmur. No friction rub. No gallop.   Pulmonary:     Effort: Pulmonary effort is normal. No respiratory distress.     Breath sounds: Normal breath sounds. No stridor. No wheezing, rhonchi or rales.  Abdominal:     General: Bowel sounds are normal. There is no distension.     Palpations: Abdomen is soft.  There is no mass.     Tenderness: There is abdominal tenderness (Right flank). There is right CVA tenderness. There is no left CVA tenderness, guarding or rebound.  Musculoskeletal: Normal range of motion.        General: No swelling.     Right lower leg: No edema.     Left lower leg: No edema.  Skin:    General: Skin is warm and dry.     Coloration: Skin is not pale.     Findings: No rash.  Neurological:     General: No focal deficit present.     Mental Status: She is alert and oriented to person, place, and time.      Cranial Nerves: No cranial nerve deficit.     Coordination: Coordination normal.     Deep Tendon Reflexes: Reflexes normal.  Psychiatric:        Mood and Affect: Mood normal.        Behavior: Behavior normal.        Thought Content: Thought content normal.        Judgment: Judgment normal.     Results for orders placed or performed during the hospital encounter of 10/15/18 (from the past 24 hour(s))  POCT urinalysis dip (device)     Status: Abnormal   Collection Time: 10/15/18  3:22 PM  Result Value Ref Range   Glucose, UA NEGATIVE NEGATIVE mg/dL   Bilirubin Urine NEGATIVE NEGATIVE   Ketones, ur NEGATIVE NEGATIVE mg/dL   Specific Gravity, Urine 1.020 1.005 - 1.030   Hgb urine dipstick SMALL (A) NEGATIVE   pH 7.0 5.0 - 8.0   Protein, ur NEGATIVE NEGATIVE mg/dL   Urobilinogen, UA 0.2 0.0 - 1.0 mg/dL   Nitrite POSITIVE (A) NEGATIVE   Leukocytes,Ua SMALL (A) NEGATIVE     Assessment and Plan :   1. Acute pyelonephritis   2. Right flank pain   3. Acute right-sided thoracic back pain   4. History of cholecystectomy   5. Elevated blood-pressure reading without diagnosis of hypertension   6. Gastroesophageal reflux disease, esophagitis presence not specified     Will cover for pyelonephritis given patient's right flank and CVA tenderness.  IM ceftriaxone in clinic, ciprofloxacin p.o., urine culture pending.  Recommended patient hydrate well.  Use Tylenol for pain.  Start Nexium to help address heartburn.  Recommended dietary and lifestyle modifications in general.  Patient provided with information for [facility] to help establish care with a PCP. Counseled patient on potential for adverse effects with medications prescribed/recommended today, ER and return-to-clinic precautions discussed, patient verbalized understanding.   Wallis BambergMani, Keyonni Percival, [place] JerseyPA-C 10/15/18 1539

## 2018-10-17 ENCOUNTER — Telehealth (HOSPITAL_COMMUNITY): Payer: Self-pay | Admitting: Emergency Medicine

## 2018-10-17 LAB — URINE CULTURE: Culture: >=100000 — AB

## 2018-10-17 NOTE — Telephone Encounter (Signed)
Urine culture was positive for e coli and was given cipro  at urgent care visit. Pt contacted and made aware, educated on completing antibiotic and to follow up if symptoms are persistent. Verbalized understanding.    

## 2019-01-05 ENCOUNTER — Ambulatory Visit
Admission: EM | Admit: 2019-01-05 | Discharge: 2019-01-05 | Disposition: A | Discharge status: Home / Self Care | Payer: Medicaid Other

## 2019-01-05 ENCOUNTER — Other Ambulatory Visit: Payer: Self-pay

## 2019-01-05 ENCOUNTER — Encounter: Payer: Self-pay | Admitting: Emergency Medicine

## 2019-01-05 DIAGNOSIS — J029 Acute pharyngitis, unspecified: Secondary | ICD-10-CM

## 2019-01-05 DIAGNOSIS — R059 Cough, unspecified: Secondary | ICD-10-CM

## 2019-01-05 DIAGNOSIS — R05 Cough: Secondary | ICD-10-CM

## 2019-01-05 MED ORDER — BENZONATATE 100 MG PO CAPS: 100.0000 mg | ORAL_CAPSULE | Freq: Three times a day (TID) | ORAL | 0 refills | Status: DC

## 2019-01-05 MED ORDER — FLUTICASONE PROPIONATE 50 MCG/ACT NA SUSP: 2.0000 | Freq: Every day | NASAL | 0 refills | Status: DC

## 2019-01-05 MED ORDER — FAMOTIDINE 20 MG PO TABS: 20.0000 mg | ORAL_TABLET | Freq: Every day | ORAL | 0 refills | Status: AC

## 2019-01-05 NOTE — Discharge Instructions (Signed)
As discussed, cannot rule out COVID. Currently, no alarming signs. Testing ordered. I would like you to quarantine until testing results. You can use tessalon if needed for cough. Flonase for nasal congestion, drainage. If experiencing shortness of breath, trouble breathing, go to the ED for further evaluation needed.  You can discontinue nexium and try pepcid for your symptoms. If still not tolerating, or not controlling symptoms, follow up with PCP/GI for further evaluation needed.

## 2019-01-05 NOTE — ED Triage Notes (Signed)
Pt presents to [facility] for assessment of "full head" feeling x 3 days with nasal congestion, left sided sore throat (which she states has resolved), with mild cough and post-nasal drip.  Pt c/o of feeling super hot last night while trying to sleep.

## 2019-01-05 NOTE — ED Notes (Signed)
Patient able to ambulate independently  

## 2019-01-05 NOTE — ED Provider Notes (Signed)
[facility]    CSN: [id number] Arrival date & time: 01/05/19  1026      History   Chief Complaint Chief Complaint  Patient presents with  . Headache  . Sore Throat  . Cough    HPI Rebecca Li is a 28 y.o. female.   28 year old female comes in for 3-day history of URI symptoms.  Has had "forehead" feeling, nasal congestion, sinus pressure, left-sided sore throat, mild cough, postnasal drip.  Has had subjective fever without chills or body aches.  Denies shortness of breath.  Has had mild diarrhea without abdominal pain, nausea, vomiting.  Denies loss of taste or smell.  Works at [employer].  No known Covid contact.  Mother started having similar symptoms.     Past Medical History:  Diagnosis Date  . Major depression   . Morbid obesity [facility])     Patient Active Problem List   Diagnosis Date Noted  . Dysthymia 06/05/2016  . 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 Calculus of gallbladder with other cholecystitis, without mention of obstruction 02/26/2013  . 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 WRIST PAIN 07/16/2008    Past Surgical History:  Procedure Laterality Date  . CESAREAN SECTION  2006,2011,2014  . Nexplanon Insert  05/11/2012, 07/22/2015  . Nexplanon removal  07/22/2015  . WISDOM TOOTH EXTRACTION      OB History    Gravida  3   Para  3   Term  3   Preterm      AB      Living  2     SAB      TAB      Ectopic      Multiple      Live Births  3        Obstetric Comments  1st Menstrual Cycle: 10 1st Pregnancy:  65 1st baby died of SIDS         Home Medications    Prior to Admission medications   Medication Sig Start Date End Date Taking? Authorizing Provider  albuterol (VENTOLIN HFA) 108 (90 Base) MCG/ACT inhaler Inhale 2 puffs into the lungs every 6 (six) hours as needed for wheezing or shortness of breath.   Yes [provider]  benzonatate (TESSALON) 100 MG capsule Take 1  capsule (100 mg total) by mouth every 8 (eight) hours. 01/05/19   Tasia Catchings, Vaishnav Demartin V, PA-C  famotidine (PEPCID) 20 MG tablet Take 1 tablet (20 mg total) by mouth daily. 01/05/19   Tasia Catchings, Jerrilynn Mikowski V, PA-C  fluticasone (FLONASE) 50 MCG/ACT nasal spray Place 2 sprays into both nostrils daily. 01/05/19   Tasia Catchings, Gwyn Hieronymus V, PA-C  esomeprazole (NEXIUM) 20 MG capsule Take 1 capsule (20 mg total) by mouth daily before breakfast. 10/15/18 01/05/19  Jaynee Eagles, PA-C    Family History Family History  Problem Relation Age of Onset  . Hypertension Mother   . Diabetes Maternal Grandmother   . Hypertension Maternal Grandmother   . Prostate cancer Maternal Grandfather   . Diabetes Paternal Grandmother   . Hypertension Paternal Grandmother     Social History Social History   Tobacco Use  . Smoking status: Current Every Day Smoker    Packs/day: 1.00    Years: 3.00    Pack years: 3.00  . Smokeless tobacco: Never Used  Substance Use Topics  . Alcohol use: No  . Drug use: No     Allergies   Sulfamethoxazole-trimethoprim and Coconut oil  Review of Systems Review of Systems  Reason unable to perform ROS: See HPI as above.     Physical Exam Triage Vital Signs ED Triage Vitals  Enc Vitals Group     BP 01/05/19 1041 (!) 152/83     Pulse Rate 01/05/19 1041 88     Resp 01/05/19 1041 18     Temp 01/05/19 1041 98.7 F (37.1 C)     Temp Source 01/05/19 1041 Temporal     SpO2 01/05/19 1041 97 %     Weight --      Height --      Head Circumference --      Peak Flow --      Pain Score 01/05/19 1042 3     Pain Loc --      Pain Edu? --      Excl. in GC? --    No data found.  Updated Vital Signs BP (!) 152/83 (BP Location: Left Wrist)   Pulse 88   Temp 98.7 F (37.1 C) (Temporal)   Resp 18   LMP 12/30/2018   SpO2 97%   Physical Exam Constitutional:      General: She is not in acute distress.    Appearance: Normal appearance. She is not ill-appearing, toxic-appearing or diaphoretic.  HENT:     Head:  Normocephalic and atraumatic.     Nose:     Right Sinus: No maxillary sinus tenderness or frontal sinus tenderness.     Left Sinus: No maxillary sinus tenderness or frontal sinus tenderness.     Mouth/Throat:     Mouth: Mucous membranes are moist.     Pharynx: Oropharynx is clear. Uvula midline.  Neck:     Musculoskeletal: Normal range of motion and neck supple.  Cardiovascular:     Rate and Rhythm: Normal rate and regular rhythm.     Heart sounds: Normal heart sounds. No murmur. No friction rub. No gallop.   Pulmonary:     Effort: Pulmonary effort is normal. No accessory muscle usage, prolonged expiration, respiratory distress or retractions.     Comments: Lungs clear to auscultation without adventitious lung sounds. Neurological:     General: No focal deficit present.     Mental Status: She is alert and oriented to person, place, and time.      UC Treatments / Results  Labs (all labs ordered are listed, but only abnormal results are displayed) Labs Reviewed  NOVEL CORONAVIRUS, NAA    EKG   Radiology No results found.  Procedures Procedures (including critical care time)  Medications Ordered in UC Medications - No data to display  Initial Impression / Assessment and Plan / UC Course  I have reviewed the triage vital signs and the nursing notes.  Pertinent labs & imaging results that were available during my care of the patient were reviewed by me and considered in my medical decision making (see chart for details).    No alarming signs on exam.  Patient speaking in full sentences without respiratory distress.  COVID testing ordered.  Patient to quarantine until testing results return.  Symptomatic treatment discussed.  Push fluids.  Return precautions given.  Patient expresses understanding and agrees to plan.  At end of visit, patient states unable to tolerate Nexium for her acid reflux.  Will have patient discontinue Nexium and try Pepcid.  Follow-up with PCP/GI if  still not having good control of her acid reflux.  Final Clinical Impressions(s) / UC Diagnoses   Final diagnoses:  Sore throat  Cough   ED Prescriptions    Medication Sig Dispense Auth. Provider   famotidine (PEPCID) 20 MG tablet Take 1 tablet (20 mg total) by mouth daily. 15 tablet Markea Ruzich V, PA-C   fluticasone (FLONASE) 50 MCG/ACT nasal spray Place 2 sprays into both nostrils daily. 1 g Francisco Eyerly V, PA-C   benzonatate (TESSALON) 100 MG capsule Take 1 capsule (100 mg total) by mouth every 8 (eight) hours. 21 capsule Belinda Fisher, PA-C     PDMP not reviewed this encounter.   Belinda Fisher, PA-C 01/05/19 1223

## 2019-01-06 ENCOUNTER — Telehealth: Payer: Self-pay

## 2019-01-07 IMAGING — CT CT RENAL STONE PROTOCOL
2 of 4 series · 16 of 46 positions shown, 18 images · non-contrast
Comparison: 06/11/2010 CT

CLINICAL DATA: Left flank pain since last evening. Nausea and
vomiting. Cholecystectomy.

EXAM:
CT ABDOMEN AND PELVIS WITHOUT CONTRAST
TECHNIQUE: Multidetector CT imaging of the abdomen and pelvis was performed
following the standard protocol without IV contrast.

[Series 2: stone full standard · axial · 0.64mm/px · z∈[-41,+399]mm · 13 of 96 slices shown, 15 images]
[im 4/96  soft-tissue]
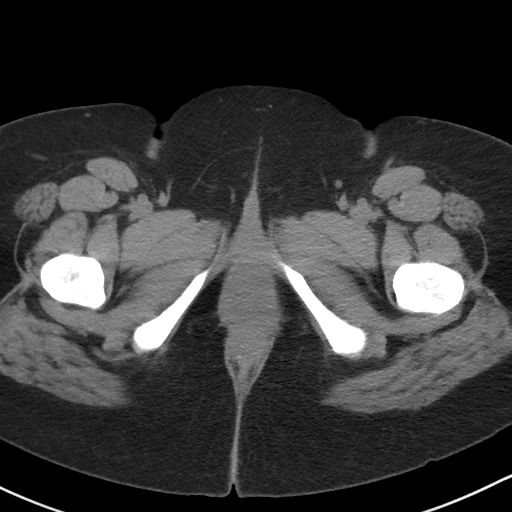
[im 4/96  bone]
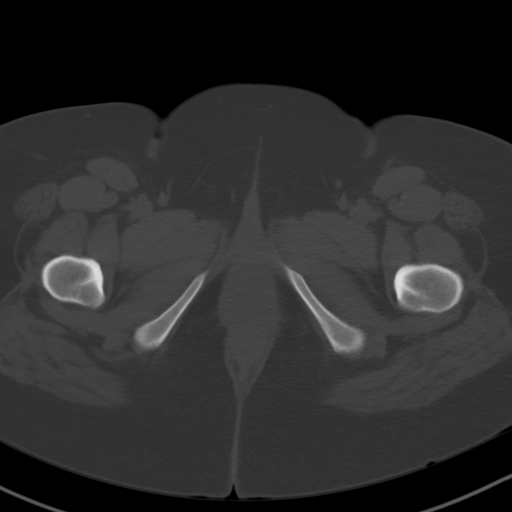
[im 12/96  soft-tissue]
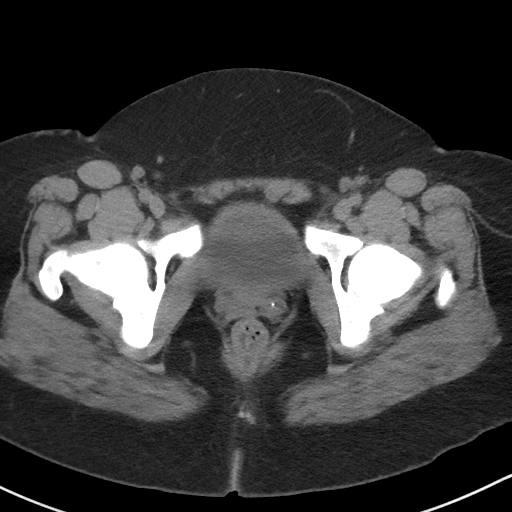
[im 20/96  soft-tissue]
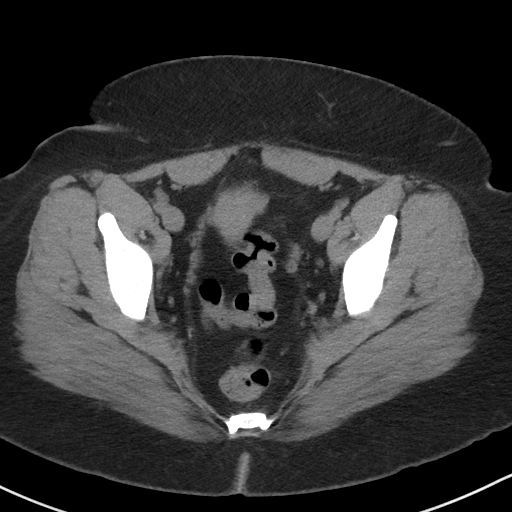
[im 27/96  soft-tissue]
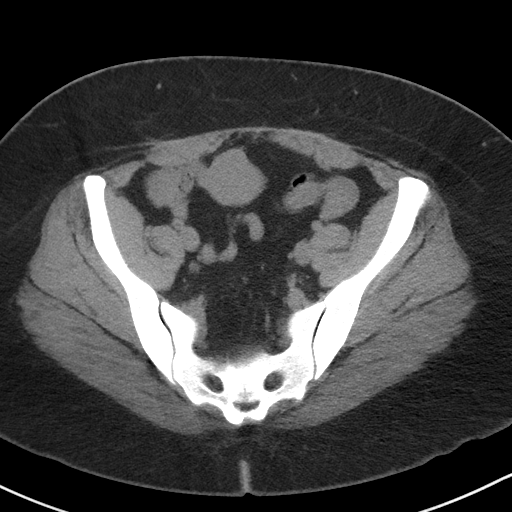
[im 35/96  soft-tissue]
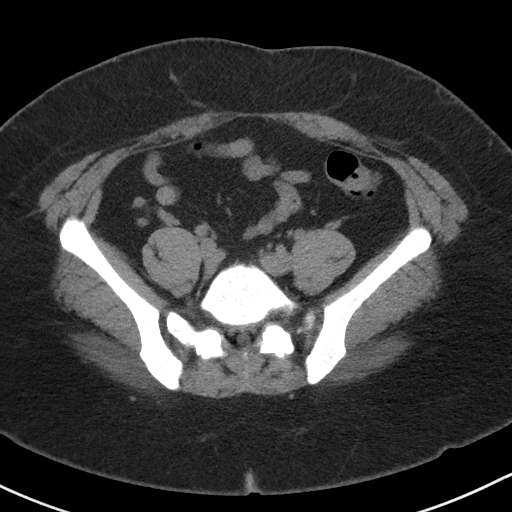
[im 42/96  soft-tissue]
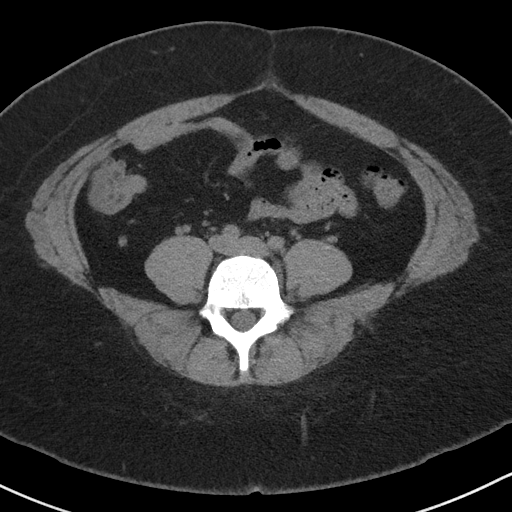
[im 50/96  soft-tissue]
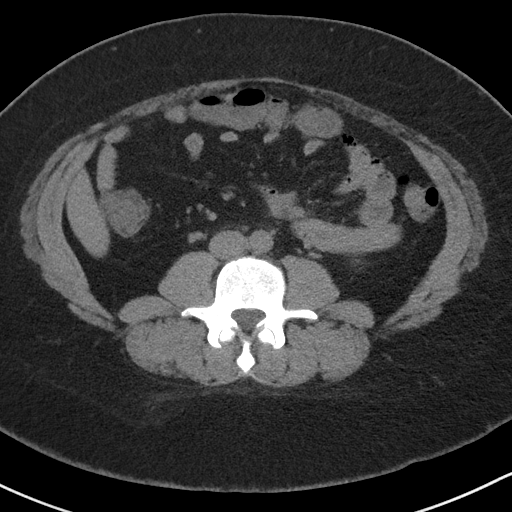
[im 54/96  soft-tissue]
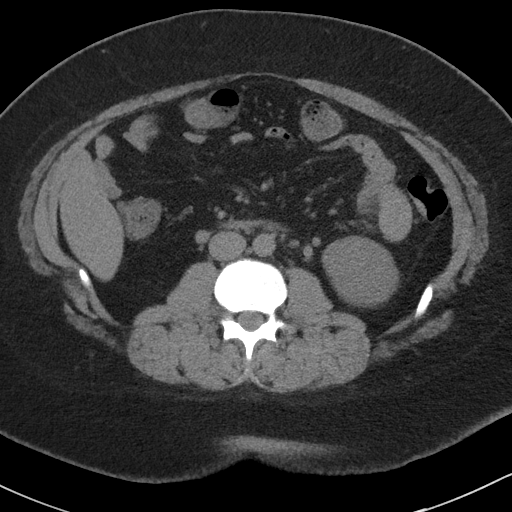
[im 61/96  soft-tissue]
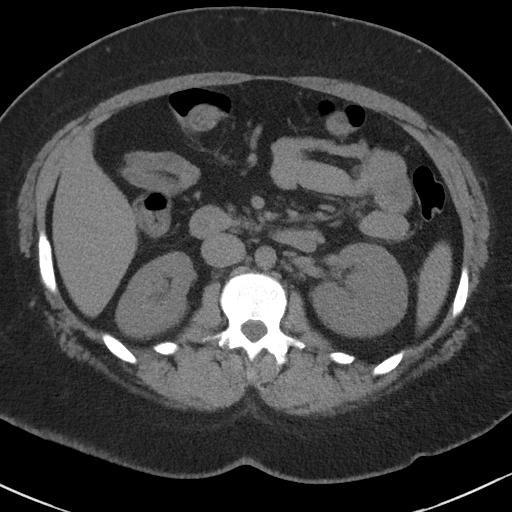
[im 61/96  bone]
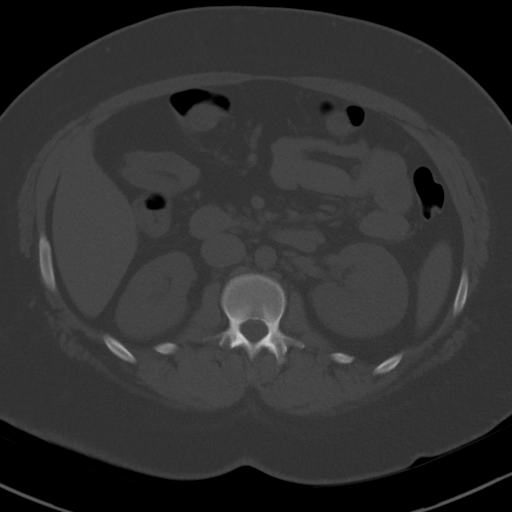
[im 69/96  soft-tissue]
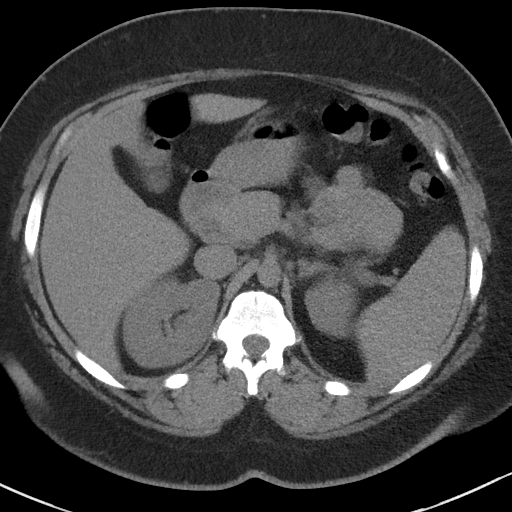
[im 77/96  soft-tissue]
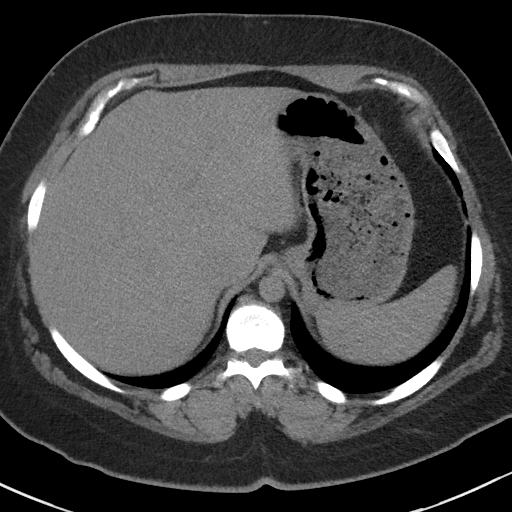
[im 84/96  soft-tissue]
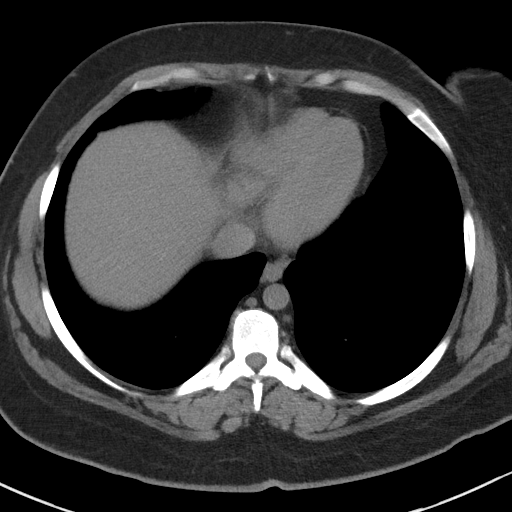
[im 92/96  soft-tissue]
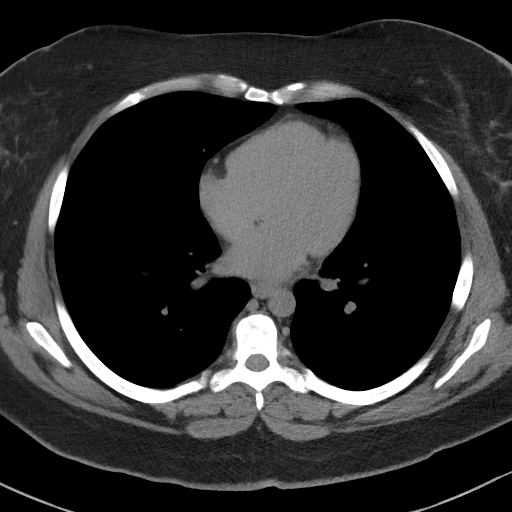

[Series 5: coronal · coronal · 0.78mm/px · 3 of 153 slices shown]
[im 51/153  soft-tissue]
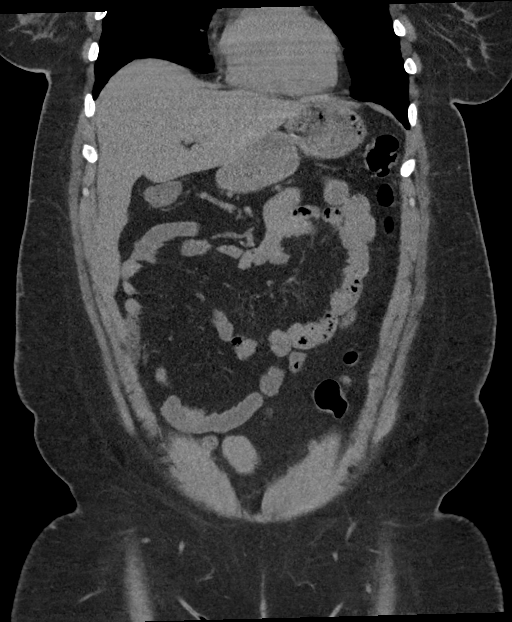
[im 68/153  soft-tissue]
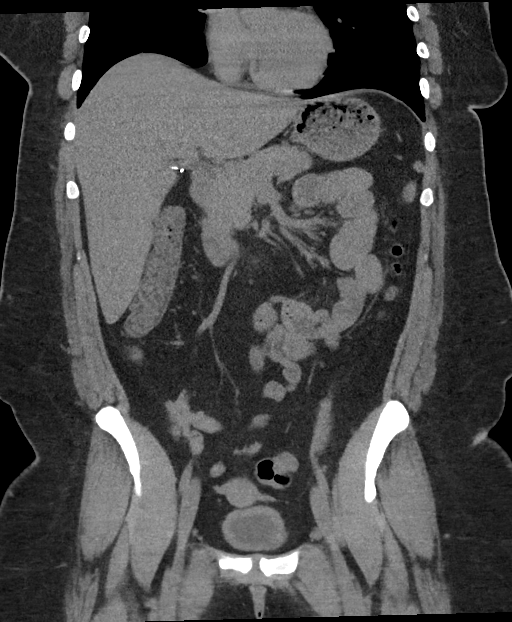
[im 85/153  soft-tissue]
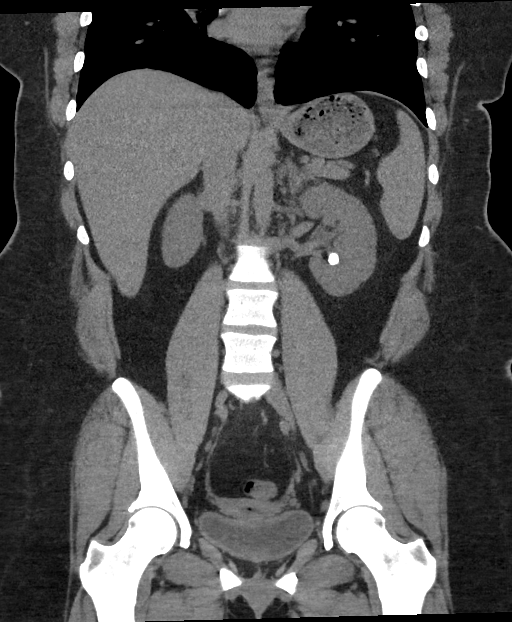

[16 of 46 positions shown; findings below may reference images not displayed]

FINDINGS: Lower chest: Normal heart size. No pericardial effusion. Clear lung
bases.

Hepatobiliary: Cholecystectomy. No space-occupying mass or biliary
dilatation of the liver given limitations of a noncontrast study.

Pancreas: Normal

Spleen: Normal

Adrenals/Urinary Tract: Normal bilateral adrenal glands.
Nonobstructing calculus in the lower pole of the left kidney is
identified measuring approximately 10 x 9 x 9 mm. No
hydroureteronephrosis. No ureteral calculi. The urinary bladder is
slightly thick-walled in appearance, nonspecific but is more likely
due to underdistention. A cystitis is not excluded. No definite
intraluminal mass or calculus is appreciated. Correlate for
cystitis.

Stomach/Bowel: Physiologic distention of the stomach. The duodenal
sweep and ligament of Treitz are normal. No small bowel obstruction
or inflammation. Normal appendix is noted. The distal and terminal
ileum are unremarkable. Nonobstructed large bowel without
inflammatory change. Probable right colonic spasm accounting for the
contracted appearance. Average stool retention is seen.

Vascular/Lymphatic: No significant vascular findings are present. No
enlarged abdominal or pelvic lymph nodes.

Reproductive: Uterus and adnexa are unremarkable. A single surgical
staple projects over the left hemipelvis possibly from prior tubal
ligation or displaced from the right upper quadrant.

Other: No free air nor free fluid.

Musculoskeletal: No acute or significant osseous findings.
IMPRESSION: Nonobstructing 10 x 9 x 9 mm calculus in the lower pole the left
kidney. No obstructive uropathy. Mild thick-walled appearance of the
urinary bladder is likely due to underdistention. Cystitis is not
entirely excluded. This can be correlated clinically.

## 2019-01-08 LAB — NOVEL CORONAVIRUS, NAA: SARS-CoV-2, NAA: NOT DETECTED

## 2019-02-24 ENCOUNTER — Emergency Department (HOSPITAL_BASED_OUTPATIENT_CLINIC_OR_DEPARTMENT_OTHER)
Admission: EM | Admit: 2019-02-24 | Discharge: 2019-02-24 | Disposition: A | Discharge status: Home / Self Care | Payer: Medicaid Other | Attending: Emergency Medicine | Admitting: Emergency Medicine

## 2019-02-24 ENCOUNTER — Encounter (HOSPITAL_BASED_OUTPATIENT_CLINIC_OR_DEPARTMENT_OTHER): Payer: Self-pay

## 2019-02-24 ENCOUNTER — Other Ambulatory Visit: Payer: Self-pay

## 2019-02-24 ENCOUNTER — Ambulatory Visit
Admission: EM | Admit: 2019-02-24 | Discharge: 2019-02-24 | Disposition: A | Discharge status: Home / Self Care | Payer: Medicaid Other | Attending: Physician Assistant | Admitting: Physician Assistant

## 2019-02-24 DIAGNOSIS — R112 Nausea with vomiting, unspecified: Secondary | ICD-10-CM | POA: Insufficient documentation

## 2019-02-24 DIAGNOSIS — R197 Diarrhea, unspecified: Secondary | ICD-10-CM

## 2019-02-24 DIAGNOSIS — Z5321 Procedure and treatment not carried out due to patient leaving prior to being seen by health care provider: Secondary | ICD-10-CM | POA: Insufficient documentation

## 2019-02-24 DIAGNOSIS — R109 Unspecified abdominal pain: Secondary | ICD-10-CM | POA: Diagnosis not present

## 2019-02-24 DIAGNOSIS — Z20828 Contact with and (suspected) exposure to other viral communicable diseases: Secondary | ICD-10-CM

## 2019-02-24 DIAGNOSIS — R1084 Generalized abdominal pain: Secondary | ICD-10-CM | POA: Diagnosis not present

## 2019-02-24 DIAGNOSIS — R0981 Nasal congestion: Secondary | ICD-10-CM | POA: Diagnosis not present

## 2019-02-24 MED ORDER — SODIUM CHLORIDE 0.9% FLUSH
3.0000 mL | Freq: Once | INTRAVENOUS | Status: AC
  Filled 2019-02-24: qty 3

## 2019-02-24 MED ORDER — ONDANSETRON 4 MG PO TBDP: 4.0000 mg | ORAL_TABLET | Freq: Three times a day (TID) | ORAL | 0 refills | Status: AC | PRN

## 2019-02-24 MED ORDER — DICYCLOMINE HCL 20 MG PO TABS: 20.0000 mg | ORAL_TABLET | Freq: Two times a day (BID) | ORAL | 0 refills | Status: AC

## 2019-02-24 NOTE — ED Provider Notes (Signed)
[facility]    CSN: [id number] Arrival date & time: 02/24/19  1855      History   Chief Complaint Chief Complaint  Patient presents with  . Generalized Body Aches    HPI Rebecca Li is a 28 y.o. female.   28 year old female comes in for 3 day history of URI symptoms. Cough, body aches, rhinorrhea, nasal congestion. Denies fever, chills. Has had abdominal pain that is generalized, worse with vomiting. Able to tolerate food, but has been having trouble keeping down fluids. Multiple episodes of diarrhea without melena, hematochezia. Denies urinary symptoms such as frequency, dysuria, hematuria. Nexplanon implant. Current every day smoker.     Past Medical History:  Diagnosis Date  . Major depression   . Morbid obesity [facility])     Patient Active Problem List   Diagnosis Date Noted  . Dysthymia 06/05/2016  . 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 Calculus of gallbladder with other cholecystitis, without mention of obstruction 02/26/2013  . 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 WRIST PAIN 07/16/2008    Past Surgical History:  Procedure Laterality Date  . CESAREAN SECTION  2006,2011,2014  . Nexplanon Insert  05/11/2012, 07/22/2015  . Nexplanon removal  07/22/2015  . WISDOM TOOTH EXTRACTION      OB History    Gravida  3   Para  3   Term  3   Preterm      AB      Living  2     SAB      TAB      Ectopic      Multiple      Live Births  3        Obstetric Comments  1st Menstrual Cycle: 10 1st Pregnancy:  25 1st baby died of SIDS         Home Medications    Prior to Admission medications   Medication Sig Start Date End Date Taking? Authorizing Provider  albuterol (VENTOLIN HFA) 108 (90 Base) MCG/ACT inhaler Inhale 2 puffs into the lungs every 6 (six) hours as needed for wheezing or shortness of breath.    [provider]  dicyclomine (BENTYL) 20 MG tablet Take 1 tablet (20 mg total) by mouth  2 (two) times daily. 02/24/19   Tasia Catchings, Jacinto Keil V, PA-C  famotidine (PEPCID) 20 MG tablet Take 1 tablet (20 mg total) by mouth daily. 01/05/19   Tasia Catchings, Anshika Pethtel V, PA-C  ondansetron (ZOFRAN ODT) 4 MG disintegrating tablet Take 1 tablet (4 mg total) by mouth every 8 (eight) hours as needed for nausea or vomiting. 02/24/19   Ok Edwards, PA-C  esomeprazole (NEXIUM) 20 MG capsule Take 1 capsule (20 mg total) by mouth daily before breakfast. 10/15/18 01/05/19  Jaynee Eagles, PA-C    Family History Family History  Problem Relation Age of Onset  . Hypertension Mother   . Diabetes Maternal Grandmother   . Hypertension Maternal Grandmother   . Prostate cancer Maternal Grandfather   . Diabetes Paternal Grandmother   . Hypertension Paternal Grandmother     Social History Social History   Tobacco Use  . Smoking status: Current Every Day Smoker    Packs/day: 1.00    Years: 3.00    Pack years: 3.00  . Smokeless tobacco: Never Used  Substance Use Topics  . Alcohol use: No  . Drug use: No     Allergies   Sulfamethoxazole-trimethoprim and Coconut oil   Review of  Systems Review of Systems  Reason unable to perform ROS: See HPI as above.     Physical Exam Triage Vital Signs ED Triage Vitals [02/24/19 1909]  Enc Vitals Group     BP (!) 168/117     Pulse Rate 83     Resp 16     Temp 99 F (37.2 C)     Temp Source Oral     SpO2 98 %     Weight      Height      Head Circumference      Peak Flow      Pain Score 7     Pain Loc      Pain Edu?      Excl. in GC?    No data found.  Updated Vital Signs BP (!) 168/117 (BP Location: Left Arm)   Pulse 83   Temp 99 F (37.2 C) (Oral)   Resp 16   SpO2 98%   Physical Exam Constitutional:      General: She is not in acute distress.    Appearance: Normal appearance. She is not ill-appearing, toxic-appearing or diaphoretic.  HENT:     Head: Normocephalic and atraumatic.     Mouth/Throat:     Mouth: Mucous membranes are moist.     Pharynx:  Oropharynx is clear. Uvula midline.  Neck:     Musculoskeletal: Normal range of motion and neck supple.  Cardiovascular:     Rate and Rhythm: Normal rate and regular rhythm.     Heart sounds: Normal heart sounds. No murmur. No friction rub. No gallop.   Pulmonary:     Effort: Pulmonary effort is normal. No accessory muscle usage, prolonged expiration, respiratory distress or retractions.     Comments: Lungs clear to auscultation without adventitious lung sounds. Abdominal:     General: Bowel sounds are normal.     Palpations: Abdomen is soft.     Tenderness: There is generalized abdominal tenderness.     Comments: Patient crying after abdominal exam. However, no obvious rebound or guarding.   Neurological:     General: No focal deficit present.     Mental Status: She is alert and oriented to person, place, and time.     UC Treatments / Results  Labs (all labs ordered are listed, but only abnormal results are displayed) Labs Reviewed  NOVEL CORONAVIRUS, NAA    EKG   Radiology No results found.  Procedures Procedures (including critical care time)  Medications Ordered in UC Medications - No data to display  Initial Impression / Assessment and Plan / UC Course  I have reviewed the triage vital signs and the nursing notes.  Pertinent labs & imaging results that were available during my care of the patient were reviewed by me and considered in my medical decision making (see chart for details).    Patient crying after abdominal exam with generalized tenderness. No guarding or rebound during exam. Discussed with significant pain, cannot rule out acute abdomen, and may need further evaluation at the emergency department. Patient would like to defer ED visit at this time. Will test for COVID, patient to remain in quarantine until testing results return.  Otherwise, will provide symptomatic treatment and have patient continue close monitoring.  Strict return precautions given.   Patient expresses understanding and agrees to plan.  Final Clinical Impressions(s) / UC Diagnoses   Final diagnoses:  Nasal congestion  Nausea vomiting and diarrhea  Generalized abdominal pain   ED Prescriptions  Medication Sig Dispense Auth. Provider   dicyclomine (BENTYL) 20 MG tablet Take 1 tablet (20 mg total) by mouth 2 (two) times daily. 20 tablet Zhane Donlan V, PA-C   ondansetron (ZOFRAN ODT) 4 MG disintegrating tablet Take 1 tablet (4 mg total) by mouth every 8 (eight) hours as needed for nausea or vomiting. 20 tablet Belinda FisherYu, Lealer Marsland V, PA-C     PDMP not reviewed this encounter.   Belinda FisherYu, Hennessy Bartel V, PA-C 02/24/19 1944

## 2019-02-24 NOTE — Discharge Instructions (Addendum)
COVID testing ordered. I would like you to quarantine until testing results. Zofran for nausea and vomiting as needed. Bentyl for abdominal cramping. Keep hydrated, you urine should be clear to pale yellow in color. Bland diet, advance as tolerated. As discussed, if continues with significant abdominal pain, nausea or vomiting not controlled by medication, fever, go to the emergency department for further evaluation needed. If experiencing shortness of breath, trouble breathing, go to the emergency department for further evaluation needed.

## 2019-02-24 NOTE — ED Triage Notes (Signed)
Pt c/o cough and body aches x3 days. States unable to keep any liquids down, food is staying down ok.,

## 2019-02-24 NOTE — ED Triage Notes (Signed)
Pt c/o abd pain n/v/d x 3 days-NAD-steady gait

## 2019-02-27 LAB — NOVEL CORONAVIRUS, NAA: SARS-CoV-2, NAA: DETECTED — AB

## 2019-02-28 ENCOUNTER — Telehealth (HOSPITAL_COMMUNITY): Payer: Self-pay | Admitting: Emergency Medicine

## 2019-02-28 NOTE — Telephone Encounter (Signed)
Your test for COVID-19 was positive, meaning that you were infected with the novel coronavirus and could give the germ to others.  Please continue isolation at home for at least 10 days since the start of your symptoms. If you do not have symptoms, please isolate at home for 10 days from the day you were tested. Once you complete your 10 day quarantine, you may return to normal activities as long as you've not had a fever for over 24 hours(without taking fever reducing medicine) and your symptoms are improving. Please continue good preventive care measures, including:  frequent hand-washing, avoid touching your face, cover coughs/sneezes, stay out of crowds and keep a 6 foot distance from others.  Go to the nearest hospital emergency room if fever/cough/breathlessness are severe or illness seems like a threat to life.  Patient contacted by phone and made aware of    results. Pt verbalized understanding, she had no questions.   

## 2019-07-23 ENCOUNTER — Encounter: Payer: Self-pay | Admitting: *Deleted

## 2019-07-28 ENCOUNTER — Telehealth: Payer: Self-pay | Admitting: *Deleted

## 2019-07-28 ENCOUNTER — Ambulatory Visit: Payer: Medicaid Other | Admitting: Diagnostic Neuroimaging

## 2019-07-28 NOTE — Telephone Encounter (Signed)
Patient was no show for new patient appointment today. 

## 2019-09-08 ENCOUNTER — Encounter: Payer: Self-pay | Admitting: Diagnostic Neuroimaging

## 2019-09-08 ENCOUNTER — Telehealth: Payer: Self-pay | Admitting: *Deleted

## 2019-09-08 ENCOUNTER — Ambulatory Visit: Payer: Medicaid Other | Admitting: Diagnostic Neuroimaging

## 2019-09-08 NOTE — Telephone Encounter (Signed)
Patient was no show for new patient appointment today.
